# Patient Record
Sex: Female | Born: 1960 | Race: Black or African American | Hispanic: No | Marital: Single | Smoking: Never smoker
Health system: Southern US, Community
[De-identification: ages and names within clinical notes are randomized; demographics above are authoritative.]

## PROBLEM LIST (undated history)

## (undated) DIAGNOSIS — G4730 Sleep apnea, unspecified: Secondary | ICD-10-CM

## (undated) DIAGNOSIS — E890 Postprocedural hypothyroidism: Secondary | ICD-10-CM

## (undated) DIAGNOSIS — I10 Essential (primary) hypertension: Secondary | ICD-10-CM

## (undated) DIAGNOSIS — D649 Anemia, unspecified: Secondary | ICD-10-CM

## (undated) DIAGNOSIS — M722 Plantar fascial fibromatosis: Secondary | ICD-10-CM

## (undated) DIAGNOSIS — E039 Hypothyroidism, unspecified: Secondary | ICD-10-CM

## (undated) DIAGNOSIS — M545 Low back pain: Secondary | ICD-10-CM

## (undated) DIAGNOSIS — K649 Unspecified hemorrhoids: Secondary | ICD-10-CM

## (undated) DIAGNOSIS — D139 Benign neoplasm of ill-defined sites within the digestive system: Secondary | ICD-10-CM

## (undated) DIAGNOSIS — E042 Nontoxic multinodular goiter: Secondary | ICD-10-CM

## (undated) DIAGNOSIS — G4700 Insomnia, unspecified: Secondary | ICD-10-CM

## (undated) DIAGNOSIS — K449 Diaphragmatic hernia without obstruction or gangrene: Secondary | ICD-10-CM

## (undated) HISTORY — DX: Sleep apnea, unspecified: G47.30

## (undated) HISTORY — DX: Benign neoplasm of ill-defined sites within the digestive system: D13.9

## (undated) HISTORY — DX: Low back pain: M54.5

## (undated) HISTORY — PX: ANKLE SURGERY: SHX546

## (undated) HISTORY — DX: Insomnia, unspecified: G47.00

## (undated) HISTORY — DX: Unspecified hemorrhoids: K64.9

## (undated) HISTORY — DX: Essential (primary) hypertension: I10

## (undated) HISTORY — PX: PANENDOSCOPY: SHX2159

## (undated) HISTORY — DX: Nontoxic multinodular goiter: E04.2

## (undated) HISTORY — DX: Plantar fascial fibromatosis: M72.2

## (undated) HISTORY — DX: Anemia, unspecified: D64.9

## (undated) HISTORY — DX: Postprocedural hypothyroidism: E89.0

## (undated) HISTORY — DX: Hypothyroidism, unspecified: E03.9

## (undated) HISTORY — DX: Diaphragmatic hernia without obstruction or gangrene: K44.9

---

## 1999-10-23 ENCOUNTER — Other Ambulatory Visit: Admission: RE | Admit: 1999-10-23 | Discharge: 1999-10-23 | Payer: Self-pay | Admitting: Obstetrics and Gynecology

## 2002-06-24 ENCOUNTER — Other Ambulatory Visit: Admission: RE | Admit: 2002-06-24 | Discharge: 2002-06-24 | Payer: Self-pay | Admitting: *Deleted

## 2003-06-27 ENCOUNTER — Other Ambulatory Visit: Admission: RE | Admit: 2003-06-27 | Discharge: 2003-06-27 | Payer: Self-pay | Admitting: Obstetrics and Gynecology

## 2004-01-02 ENCOUNTER — Ambulatory Visit: Payer: Self-pay | Admitting: Pulmonary Disease

## 2004-01-02 ENCOUNTER — Ambulatory Visit (HOSPITAL_BASED_OUTPATIENT_CLINIC_OR_DEPARTMENT_OTHER): Admission: RE | Admit: 2004-01-02 | Discharge: 2004-01-02 | Payer: Self-pay | Admitting: Pulmonary Disease

## 2004-01-24 ENCOUNTER — Ambulatory Visit: Payer: Self-pay | Admitting: Pulmonary Disease

## 2004-02-24 ENCOUNTER — Ambulatory Visit: Payer: Self-pay | Admitting: Pulmonary Disease

## 2004-05-07 ENCOUNTER — Ambulatory Visit (HOSPITAL_BASED_OUTPATIENT_CLINIC_OR_DEPARTMENT_OTHER): Admission: RE | Admit: 2004-05-07 | Discharge: 2004-05-07 | Payer: Self-pay | Admitting: Pulmonary Disease

## 2004-05-11 ENCOUNTER — Ambulatory Visit: Payer: Self-pay | Admitting: Pulmonary Disease

## 2004-06-08 ENCOUNTER — Ambulatory Visit: Payer: Self-pay | Admitting: Pulmonary Disease

## 2004-11-20 ENCOUNTER — Ambulatory Visit: Payer: Self-pay | Admitting: Internal Medicine

## 2004-12-10 ENCOUNTER — Ambulatory Visit: Payer: Self-pay | Admitting: Internal Medicine

## 2005-01-08 ENCOUNTER — Ambulatory Visit: Payer: Self-pay | Admitting: Internal Medicine

## 2005-07-03 ENCOUNTER — Encounter: Payer: Self-pay | Admitting: Endocrinology

## 2005-11-05 ENCOUNTER — Encounter: Payer: Self-pay | Admitting: Endocrinology

## 2005-11-14 ENCOUNTER — Encounter: Payer: Self-pay | Admitting: Endocrinology

## 2005-11-27 ENCOUNTER — Other Ambulatory Visit: Admission: RE | Admit: 2005-11-27 | Discharge: 2005-11-27 | Payer: Self-pay | Admitting: Obstetrics and Gynecology

## 2006-02-13 ENCOUNTER — Emergency Department (HOSPITAL_COMMUNITY): Admission: EM | Admit: 2006-02-13 | Discharge: 2006-02-13 | Payer: Self-pay | Admitting: Emergency Medicine

## 2006-03-17 ENCOUNTER — Ambulatory Visit: Payer: Self-pay | Admitting: Gastroenterology

## 2006-03-19 ENCOUNTER — Ambulatory Visit: Payer: Self-pay | Admitting: Gastroenterology

## 2006-03-19 ENCOUNTER — Encounter (INDEPENDENT_AMBULATORY_CARE_PROVIDER_SITE_OTHER): Payer: Self-pay | Admitting: Specialist

## 2006-03-19 DIAGNOSIS — D139 Benign neoplasm of ill-defined sites within the digestive system: Secondary | ICD-10-CM

## 2006-03-19 DIAGNOSIS — K449 Diaphragmatic hernia without obstruction or gangrene: Secondary | ICD-10-CM

## 2006-03-19 DIAGNOSIS — D1399 Benign neoplasm of ill-defined sites within the digestive system: Secondary | ICD-10-CM

## 2006-03-19 HISTORY — DX: Benign neoplasm of ill-defined sites within the digestive system: D13.99

## 2006-03-19 HISTORY — DX: Benign neoplasm of ill-defined sites within the digestive system: D13.9

## 2006-03-19 HISTORY — DX: Diaphragmatic hernia without obstruction or gangrene: K44.9

## 2006-04-17 ENCOUNTER — Ambulatory Visit: Payer: Self-pay | Admitting: Gastroenterology

## 2006-04-17 LAB — CONVERTED CEMR LAB
ALT: 15 units/L (ref 0–40)
AST: 17 units/L (ref 0–37)
Bilirubin, Direct: 0.1 mg/dL (ref 0.0–0.3)
Eosinophils Relative: 1.3 % (ref 0.0–5.0)
Ferritin: 27.8 ng/mL (ref 10.0–291.0)
HCT: 34.5 % — ABNORMAL LOW (ref 36.0–46.0)
Neutrophils Relative %: 54.8 % (ref 43.0–77.0)
RBC: 3.86 M/uL — ABNORMAL LOW (ref 3.87–5.11)
RDW: 15.1 % — ABNORMAL HIGH (ref 11.5–14.6)
Total Protein: 8.5 g/dL — ABNORMAL HIGH (ref 6.0–8.3)
WBC: 8.6 10*3/uL (ref 4.5–10.5)

## 2006-08-08 ENCOUNTER — Ambulatory Visit: Payer: Self-pay | Admitting: Internal Medicine

## 2006-08-08 LAB — CONVERTED CEMR LAB
Iron: 41 ug/dL — ABNORMAL LOW (ref 42–145)
Saturation Ratios: 11.8 % — ABNORMAL LOW (ref 20.0–50.0)
Transferrin: 248.6 mg/dL (ref >=212.0)

## 2006-09-05 ENCOUNTER — Ambulatory Visit: Payer: Self-pay | Admitting: Internal Medicine

## 2006-09-05 LAB — CONVERTED CEMR LAB
AST: 18 units/L (ref 0–37)
Albumin: 3.4 g/dL — ABNORMAL LOW (ref 3.5–5.2)
Basophils Absolute: 0.1 10*3/uL (ref 0.0–0.1)
Bilirubin, Direct: 0.1 mg/dL (ref 0.0–0.3)
Chloride: 104 meq/L (ref 96–112)
Cholesterol: 93 mg/dL (ref 0–200)
Eosinophils Absolute: 0.1 10*3/uL (ref 0.0–0.6)
GFR calc Af Amer: 140 mL/min
GFR calc non Af Amer: 116 mL/min
Glucose, Bld: 94 mg/dL (ref 70–99)
HCT: 37.7 % (ref 36.0–46.0)
Lymphocytes Relative: 36.9 % (ref 12.0–46.0)
MCHC: 33.2 g/dL (ref 30.0–36.0)
MCV: 91.2 fL (ref 78.0–100.0)
Neutro Abs: 4.6 10*3/uL (ref 1.4–7.7)
Neutrophils Relative %: 54.5 % (ref 43.0–77.0)
Potassium: 3.7 meq/L (ref 3.5–5.1)
RBC: 4.13 M/uL (ref 3.87–5.11)
Sodium: 140 meq/L (ref 135–145)
TSH: 0.85 microintl units/mL (ref 0.35–5.50)
Total CHOL/HDL Ratio: 2.7
Triglycerides: 61 mg/dL (ref 0–149)

## 2006-10-28 ENCOUNTER — Ambulatory Visit: Payer: Self-pay | Admitting: Gastroenterology

## 2006-11-19 ENCOUNTER — Encounter: Payer: Self-pay | Admitting: Internal Medicine

## 2006-11-27 DIAGNOSIS — I10 Essential (primary) hypertension: Secondary | ICD-10-CM | POA: Insufficient documentation

## 2006-11-27 DIAGNOSIS — D649 Anemia, unspecified: Secondary | ICD-10-CM | POA: Insufficient documentation

## 2006-11-27 HISTORY — DX: Essential (primary) hypertension: I10

## 2006-11-27 HISTORY — DX: Anemia, unspecified: D64.9

## 2007-04-14 ENCOUNTER — Encounter: Payer: Self-pay | Admitting: Internal Medicine

## 2007-06-20 DIAGNOSIS — G4730 Sleep apnea, unspecified: Secondary | ICD-10-CM | POA: Insufficient documentation

## 2007-06-20 DIAGNOSIS — K649 Unspecified hemorrhoids: Secondary | ICD-10-CM | POA: Insufficient documentation

## 2007-06-20 HISTORY — DX: Sleep apnea, unspecified: G47.30

## 2007-06-20 HISTORY — DX: Unspecified hemorrhoids: K64.9

## 2007-06-23 ENCOUNTER — Telehealth: Payer: Self-pay | Admitting: *Deleted

## 2007-06-29 ENCOUNTER — Ambulatory Visit: Payer: Self-pay | Admitting: Internal Medicine

## 2007-06-29 LAB — CONVERTED CEMR LAB
Basophils Relative: 0.8 % (ref 0.0–1.0)
CO2: 28 meq/L (ref 19–32)
Creatinine, Ser: 0.7 mg/dL (ref 0.4–1.2)
Eosinophils Relative: 1.7 % (ref 0.0–5.0)
GFR calc non Af Amer: 97 mL/min
Glucose, Bld: 86 mg/dL (ref 70–99)
HCT: 38.3 % (ref 36.0–46.0)
Hemoglobin: 12.7 g/dL (ref 12.0–15.0)
Iron: 43 ug/dL (ref 42–145)
Lymphocytes Relative: 39.4 % (ref 12.0–46.0)
Monocytes Absolute: 0.5 10*3/uL (ref 0.1–1.0)
Monocytes Relative: 5.1 % (ref 3.0–12.0)
Neutro Abs: 5.4 10*3/uL (ref 1.4–7.7)
RBC: 4.16 M/uL (ref 3.87–5.11)
WBC: 10.3 10*3/uL (ref 4.5–10.5)

## 2007-11-24 ENCOUNTER — Encounter: Payer: Self-pay | Admitting: Internal Medicine

## 2008-02-19 ENCOUNTER — Ambulatory Visit: Payer: Self-pay | Admitting: Internal Medicine

## 2008-03-03 ENCOUNTER — Encounter: Payer: Self-pay | Admitting: Endocrinology

## 2008-06-13 ENCOUNTER — Ambulatory Visit: Payer: Self-pay | Admitting: Internal Medicine

## 2008-06-13 LAB — CONVERTED CEMR LAB
Eosinophils Absolute: 0.2 10*3/uL (ref 0.0–0.7)
Eosinophils Relative: 2.1 % (ref 0.0–5.0)
GFR calc non Af Amer: 99.43 mL/min (ref >60)
Glucose, Bld: 92 mg/dL (ref 70–99)
Iron: 33 ug/dL — ABNORMAL LOW (ref 42–145)
MCV: 91.8 fL (ref 78.0–100.0)
Monocytes Absolute: 0.7 10*3/uL (ref 0.1–1.0)
Neutrophils Relative %: 51.1 % (ref 43.0–77.0)
Platelets: 275 10*3/uL (ref 150.0–400.0)
Potassium: 3.2 meq/L — ABNORMAL LOW (ref 3.5–5.1)
Saturation Ratios: 11 % — ABNORMAL LOW (ref 20.0–50.0)
Sodium: 142 meq/L (ref 135–145)
Transferrin: 214.9 mg/dL (ref 212.0–360.0)
Vitamin B-12: 660 pg/mL (ref 211–911)
WBC: 9.6 10*3/uL (ref 4.5–10.5)

## 2008-11-28 ENCOUNTER — Telehealth (INDEPENDENT_AMBULATORY_CARE_PROVIDER_SITE_OTHER): Payer: Self-pay | Admitting: *Deleted

## 2008-11-29 ENCOUNTER — Telehealth: Payer: Self-pay | Admitting: Internal Medicine

## 2008-12-19 ENCOUNTER — Encounter: Payer: Self-pay | Admitting: Internal Medicine

## 2008-12-22 ENCOUNTER — Encounter: Payer: Self-pay | Admitting: Internal Medicine

## 2009-01-16 ENCOUNTER — Encounter: Payer: Self-pay | Admitting: Internal Medicine

## 2009-01-20 ENCOUNTER — Encounter: Payer: Self-pay | Admitting: Endocrinology

## 2009-04-15 LAB — CONVERTED CEMR LAB: Pap Smear: NORMAL

## 2009-07-13 ENCOUNTER — Ambulatory Visit: Payer: Self-pay | Admitting: Internal Medicine

## 2009-07-13 DIAGNOSIS — M722 Plantar fascial fibromatosis: Secondary | ICD-10-CM

## 2009-07-13 HISTORY — DX: Plantar fascial fibromatosis: M72.2

## 2009-07-18 LAB — CONVERTED CEMR LAB
ALT: 15 units/L (ref 0–35)
AST: 18 units/L (ref 0–37)
BUN: 11 mg/dL (ref 6–23)
Basophils Absolute: 0 10*3/uL (ref 0.0–0.1)
Bilirubin, Direct: 0 mg/dL (ref 0.0–0.3)
Calcium: 8.6 mg/dL (ref 8.4–10.5)
Creatinine, Ser: 0.6 mg/dL (ref 0.4–1.2)
Eosinophils Relative: 1.8 % (ref 0.0–5.0)
GFR calc non Af Amer: 137.93 mL/min (ref >60)
Monocytes Relative: 4.7 % (ref 3.0–12.0)
Neutrophils Relative %: 52.7 % (ref 43.0–77.0)
Platelets: 314 10*3/uL (ref 150.0–400.0)
Total Bilirubin: 0.4 mg/dL (ref 0.3–1.2)
WBC: 9.3 10*3/uL (ref 4.5–10.5)

## 2009-08-03 ENCOUNTER — Ambulatory Visit: Payer: Self-pay | Admitting: Endocrinology

## 2009-08-03 DIAGNOSIS — E042 Nontoxic multinodular goiter: Secondary | ICD-10-CM

## 2009-08-03 HISTORY — DX: Nontoxic multinodular goiter: E04.2

## 2009-08-11 ENCOUNTER — Telehealth (INDEPENDENT_AMBULATORY_CARE_PROVIDER_SITE_OTHER): Payer: Self-pay | Admitting: *Deleted

## 2009-08-17 ENCOUNTER — Encounter (HOSPITAL_COMMUNITY): Admission: RE | Admit: 2009-08-17 | Discharge: 2009-11-15 | Payer: Self-pay | Admitting: Endocrinology

## 2009-09-07 ENCOUNTER — Telehealth (INDEPENDENT_AMBULATORY_CARE_PROVIDER_SITE_OTHER): Payer: Self-pay | Admitting: *Deleted

## 2009-09-22 ENCOUNTER — Ambulatory Visit: Payer: Self-pay | Admitting: Family Medicine

## 2009-09-22 DIAGNOSIS — M545 Low back pain, unspecified: Secondary | ICD-10-CM

## 2009-09-22 HISTORY — DX: Low back pain, unspecified: M54.50

## 2009-11-24 ENCOUNTER — Ambulatory Visit (HOSPITAL_COMMUNITY): Admission: RE | Admit: 2009-11-24 | Discharge: 2009-11-24 | Payer: Self-pay | Admitting: Endocrinology

## 2009-12-01 ENCOUNTER — Telehealth: Payer: Self-pay | Admitting: Endocrinology

## 2009-12-01 ENCOUNTER — Ambulatory Visit: Payer: Self-pay | Admitting: Endocrinology

## 2009-12-01 DIAGNOSIS — M542 Cervicalgia: Secondary | ICD-10-CM | POA: Insufficient documentation

## 2009-12-01 LAB — CONVERTED CEMR LAB
Eosinophils Relative: 1.4 % (ref 0.0–5.0)
HCT: 34.9 % — ABNORMAL LOW (ref 36.0–46.0)
Hemoglobin: 11.9 g/dL — ABNORMAL LOW (ref 12.0–15.0)
Lymphs Abs: 1.9 10*3/uL (ref 0.7–4.0)
MCV: 90.9 fL (ref 78.0–100.0)
Monocytes Absolute: 0.3 10*3/uL (ref 0.1–1.0)
Neutro Abs: 4.8 10*3/uL (ref 1.4–7.7)
Platelets: 240 10*3/uL (ref 150.0–400.0)
Sed Rate: 69 mm/hr — ABNORMAL HIGH (ref 0–22)
WBC: 9 10*3/uL (ref 4.5–10.5)

## 2009-12-06 ENCOUNTER — Telehealth: Payer: Self-pay | Admitting: Internal Medicine

## 2009-12-06 ENCOUNTER — Ambulatory Visit: Payer: Self-pay | Admitting: Internal Medicine

## 2009-12-06 LAB — CONVERTED CEMR LAB
ALT: 16 units/L (ref 0–35)
Alkaline Phosphatase: 88 units/L (ref 39–117)
BUN: 12 mg/dL (ref 6–23)
Basophils Relative: 0.4 % (ref 0.0–3.0)
Bilirubin Urine: NEGATIVE
Bilirubin, Direct: 0 mg/dL (ref 0.0–0.3)
Blood in Urine, dipstick: NEGATIVE
Calcium: 8.6 mg/dL (ref 8.4–10.5)
Cholesterol: 97 mg/dL (ref 0–200)
Creatinine, Ser: 0.6 mg/dL (ref 0.4–1.2)
Eosinophils Relative: 1.6 % (ref 0.0–5.0)
GFR calc non Af Amer: 149.1 mL/min (ref >60)
HDL: 35.8 mg/dL — ABNORMAL LOW (ref >39.00)
Ketones, urine, test strip: NEGATIVE
LDL Cholesterol: 53 mg/dL (ref 0–99)
Lymphocytes Relative: 31.9 % (ref 12.0–46.0)
MCV: 92.2 fL (ref 78.0–100.0)
Monocytes Absolute: 0.5 10*3/uL (ref 0.1–1.0)
Neutrophils Relative %: 59 % (ref 43.0–77.0)
Nitrite: NEGATIVE
Platelets: 277 10*3/uL (ref 150.0–400.0)
Protein, U semiquant: NEGATIVE
RBC: 3.91 M/uL (ref 3.87–5.11)
Total Bilirubin: 0.4 mg/dL (ref 0.3–1.2)
Total CHOL/HDL Ratio: 3
Total Protein: 7.4 g/dL (ref 6.0–8.3)
Triglycerides: 40 mg/dL (ref 0.0–149.0)
Urobilinogen, UA: 0.2
VLDL: 8 mg/dL (ref 0.0–40.0)
WBC: 7.1 10*3/uL (ref 4.5–10.5)

## 2009-12-20 ENCOUNTER — Ambulatory Visit: Payer: Self-pay | Admitting: Internal Medicine

## 2009-12-20 ENCOUNTER — Encounter: Payer: Self-pay | Admitting: Internal Medicine

## 2010-03-14 ENCOUNTER — Encounter
Admission: RE | Admit: 2010-03-14 | Discharge: 2010-03-14 | Payer: Self-pay | Source: Home / Self Care | Attending: Internal Medicine | Admitting: Internal Medicine

## 2010-03-14 LAB — HM MAMMOGRAPHY: HM Mammogram: NEGATIVE

## 2010-03-22 ENCOUNTER — Ambulatory Visit: Admit: 2010-03-22 | Payer: Self-pay | Admitting: Internal Medicine

## 2010-03-28 ENCOUNTER — Ambulatory Visit: Admit: 2010-03-28 | Payer: Self-pay | Admitting: Internal Medicine

## 2010-04-07 ENCOUNTER — Encounter: Payer: Self-pay | Admitting: Internal Medicine

## 2010-04-08 ENCOUNTER — Encounter: Payer: Self-pay | Admitting: Internal Medicine

## 2010-04-08 ENCOUNTER — Encounter: Payer: Self-pay | Admitting: Endocrinology

## 2010-04-17 NOTE — Assessment & Plan Note (Signed)
Summary: PER ROBIN--OV TODAY--STC   Vital Signs:  Patient profile:   50 year old female Height:      67 inches (170.18 cm) Weight:      229.50 pounds (104.32 kg) BMI:     36.07 O2 Sat:      98 % on Room air Temp:     98.7 degrees F (37.06 degrees C) oral Pulse rate:   94 / minute BP sitting:   152 / 90  (left arm) Cuff size:   large  Vitals Entered By: Brenton Grills MA (December 01, 2009 4:16 PM)  O2 Flow:  Room air CC: Soreness in throat/recently took radioactive iodine pill/pt no longer taking Diclofenac/aj  7  Primary Provider:  Dr Lovell Sheehan  CC:  Soreness in throat/recently took radioactive iodine pill/pt no longer taking Diclofenac/aj.  History of Present Illness: pt had i-131 rx for hyperthyroidism due to multinodular goiter, 1 week ago today.  she now has 3 days of pain at the neck, worst at the right lateral neck.  no injury.  she says these sxs do not resemble upper respiratory infections she has had in the past.    Current Medications (verified): 1)  Amlodipine Besylate 10 Mg Tabs (Amlodipine Besylate) .... One By Mouth Daily 2)  Diclofenac Sodium 75 Mg Tbec (Diclofenac Sodium) .... One By Mouth Two Times A Day As Needed Pain  Allergies (verified): No Known Drug Allergies  Past History:  Past Medical History: Last updated: 11/27/2006 Insomnia Anemia-NOS Hypertension  Review of Systems  The patient denies fever.    Physical Exam  General:  obese.  no distress  Head:  the right parotid is nontender Neck:  i do not appreciate the goiter.  there is slight tenderness (but no swelling or erythema), at the right lateral neck (not near the thyroid).   Cervical Nodes:  No significant adenopathy.  Additional Exam:  Hemoglobin           [L]  11.9 g/dL                   52.8-41.3 Hematocrit           [L]  34.9 %      Sed Rate             [H]  69 mm/hr                    0-22 Amylase                   89 U/L         Impression & Recommendations:  Problem #  1:  NECK PAIN, RIGHT (ICD-723.1) Assessment New uncertain etiology  Problem # 2:  ANEMIA-NOS (ICD-285.9) uncertain etiology  Medications Added to Medication List This Visit: 1)  Azithromycin 500 Mg Tabs (Azithromycin) .Marland Kitchen.. 1 tab once daily  Other Orders: TLB-CBC Platelet - w/Differential (85025-CBCD) TLB-Sedimentation Rate (ESR) (85652-ESR) TLB-Amylase (82150-AMYL) Est. Patient Level III (24401) Est. Patient Level IV (02725)  Patient Instructions: 1)  blood tests are being ordered for you today.  please call [phone] to hear your test results. 2)  return approx 6 weeks as scheduled. 3)  here is a prescription for azithromycin 500 mg once daily. 4)  (update: i left message on phone-tree:  esr is elevated, possibly due to i-131 rx.  call next week if sxs persist). Prescriptions: AZITHROMYCIN 500 MG TABS (AZITHROMYCIN) 1 tab once daily  #6 x 0   Entered and  Authorized by:   Minus Breeding MD   Signed by:   Minus Breeding MD on 12/01/2009   Method used:   Print then Give to Patient   RxID:   [id number]

## 2010-04-17 NOTE — Assessment & Plan Note (Signed)
Summary: cpx/no pap/njr ok per bonnie/njr   Vital Signs:  Patient profile:   50 year old female Height:      67 inches Weight:      225 pounds BMI:     35.37 Temp:     98.2 degrees F oral Pulse rate:   94 / minute Pulse rhythm:   regular Resp:     14 per minute BP sitting:   152 / 80  (left arm)  Vitals Entered By: Willy Eddy, LPN (December 20, 2009 4:02 PM) CC: cpx Is Patient Diabetic? No   Primary Care Provider:  Dr Lovell Sheehan  CC:  cpx.  History of Present Illness: The pt was asked about all immunizations, health maint. services that are appropriate to their age and was given guidance on diet exercize  and weight management  Hypertension Follow-Up      This is a 50 year old woman who presents for Hypertension follow-up.  The patient denies lightheadedness, urinary frequency, headaches, edema, impotence, rash, and fatigue.  The patient denies the following associated symptoms: chest pain, chest pressure, exercise intolerance, dyspnea, palpitations, syncope, leg edema, and pedal edema.  Compliance with medications (by patient report) has been near 100%.  The patient reports that dietary compliance has been good.  The patient reports exercising 3-4X per week.    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) 10)  Sleep Apnea  (ICD-780.57) 11)  Family History of Asthma  (ICD-V17.5) 12)  Hypertension  (ICD-401.9) 13)  Anemia-nos  (ICD-285.9)  Current Problems  (verified): 1)  Neck Pain, Right  (ICD-723.1) 2)  Back Pain, Lumbar  (ICD-724.2) 3)  Goiter, Multinodular  (ICD-241.1) 4)  Hyperthyroidism  (ICD-242.90) 5)  Plantar Fasciitis, Right  (ICD-728.71) 6)  Hemorrhoids  (ICD-455.6) 7)  Hiatal Hernia  (ICD-553.3) 8)  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 One By Mouth Daily 2)  Azithromycin 500 Mg Tabs (Azithromycin) .Marland Kitchen.. 1 Tab Once Daily  Current Medications (verified): 1)  Azor 10-20 Mg Tabs (Amlodipine-Olmesartan) .... One By Mouth Daily  Allergies (verified): No Known Drug Allergies  Contraindications/Deferment of Procedures/Staging:    Test/Procedure: FLU VAX    Reason for deferment: patient declined   Past History:  Family History: Last updated: 08/03/2009 Family History of Arthritis Family History of Asthma Family History Other cancer mother had resection of a benign goiter.  Social History: Last updated: 08/03/2009 Occupation: works Environmental manager mfg. single Never Smoked  Risk Factors: Smoking Status: never (12/20/2009) Passive Smoke Exposure: no (12/20/2009)  Past medical, surgical, family and social histories (including risk factors) reviewed, and no changes noted (except as noted below).  Past Medical History: Reviewed history from 11/27/2006 and no changes required. Insomnia Anemia-NOS Hypertension  Past Surgical History: Reviewed  history from 06/20/2007 and no changes required. Panendoscopy R ankle surgery (1995)  Family History: Reviewed history from 08/03/2009 and no changes required. Family History of Arthritis Family History of Asthma Family History Other cancer mother had resection of a benign goiter.  Social History: Reviewed history from  08/03/2009 and no changes required. 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    Physical Exam  General:  Well-developed,well-nourished,in no acute distress; alert,appropriate and cooperative throughout examination Eyes:  pupils equal and pupils round.   Ears:  R ear normal and L ear normal.   Nose:  no external deformity and no nasal discharge.   Neck:  No deformities, masses, or tenderness noted. Lungs:  normal respiratory effort and no crackles.   Heart:  normal rate and regular rhythm.   Abdomen:  soft and non-tender.   Extremities:  No clubbing, cyanosis, edema, or deformity noted with normal full range of motion of all joints.   Neurologic:  gait normal and DTRs symmetrical and normal.     Impression & Recommendations:  Problem # 1:  HYPERTENSION (ICD-401.9)  Her updated medication list for this problem includes:    Azor 10-20 Mg Tabs (Amlodipine-olmesartan) ..... One by mouth daily  BP today: 152/80 Prior BP: 152/90 (12/01/2009)  Prior 10 Yr Risk Heart Disease: 7 % (06/13/2008)  Labs Reviewed: K+: 3.9 (12/06/2009) Creat: : 0.6 (12/06/2009)   Chol: 97 (12/06/2009)   HDL: 35.80 (12/06/2009)   LDL: 53 (12/06/2009)   TG: 40.0 (12/06/2009)  Problem # 2:  GOITER, MULTINODULAR (ICD-241.1) had radioactive treatment of goiter monitering of tsh and t3 free and t4 free  Problem # 3:  BACK PAIN, LUMBAR (ICD-724.2)  Discussed use of moist heat or  ice, modified activities, medications, and stretching/strengthening exercises. Back care instructions given. To be seen in 2 weeks if no improvement; sooner if worsening of symptoms.   Complete Medication List: 1)  Azor 10-20 Mg Tabs (Amlodipine-olmesartan) .... One by mouth daily  Other Orders: EKG w/ Interpretation (93000)  Patient Instructions: 1)  Please schedule a follow-up appointment in 3 months. 2)  TSH prior to visit, ICD-9:244.8 3)  T4 free and t3 free  244.8 Prescriptions: AZOR 10-20 MG TABS (AMLODIPINE-OLMESARTAN) one by mouth daily  #30 x 3   Entered and Authorized by:   Stacie Glaze MD   Signed by:   Stacie Glaze MD on 12/20/2009   Method used:   Print then Give to Patient   RxID:   [id number]    Preventive Care Screening  Mammogram:    Date:  02/14/2009    Next Due:  02/2010    Results:  normal   Pap Smear:    Date:  04/15/2009    Next Due:  04/2010    Results:  normal

## 2010-04-17 NOTE — Assessment & Plan Note (Signed)
Summary: pain in right side towards back/cjr   Vital Signs:  Patient profile:   50 year old female Weight:      225 pounds Temp:     97.9 degrees F oral BP sitting:   130 / 90  (left arm) Cuff size:   large  Vitals Entered By: Kathrynn Speed CMA (September 22, 2009 4:28 PM) CC: Pain Right side of hip , can't bend over, happen this morning was changing shoes, src   History of Present Illness: Patient seen with onset earlier today pain mostly right gluteal region.  she describes sharp pain of moderate intensity worse with bending. Similar episode about 6 months ago that eventually resolved. Denies any numbness or weakness. Took some Aleve with mild relief. No radiculopathy symptoms otherwise. No specific injury. No urinary symptoms. No pain with ambulation.  Current Medications (verified): 1)  Amlodipine Besylate 10 Mg Tabs (Amlodipine Besylate) .... One By Mouth Daily  Allergies (verified): No Known Drug Allergies  Past History:  Past Medical History: Last updated: 11/27/2006 Insomnia Anemia-NOS Hypertension PMH reviewed for relevance  Review of Systems      See HPI  Physical Exam  General:  Well-developed,well-nourished,in no acute distress; alert,appropriate and cooperative throughout examination Lungs:  Normal respiratory effort, chest expands symmetrically. Lungs are clear to auscultation, no crackles or wheezes. Heart:  normal rate and regular rhythm.   Msk:  straight leg raise is negative. Minimal tenderness right lower lumbar region Extremities:  No clubbing, cyanosis, edema, or deformity noted with normal full range of motion of all joints.   Neurologic:  strength normal in all extremities, gait normal, and DTRs symmetrical and normal.     Impression & Recommendations:  Problem # 1:  BACK PAIN, LUMBAR (ICD-724.2) Try heat or ice for symptom relief.  diclofenac and follow up with primary 2 weeks if no better. Her updated medication list for this problem includes:   Diclofenac Sodium 75 Mg Tbec (Diclofenac sodium) ..... One by mouth two times a day as needed pain  Complete Medication List: 1)  Amlodipine Besylate 10 Mg Tabs (Amlodipine besylate) .... One by mouth daily 2)  Diclofenac Sodium 75 Mg Tbec (Diclofenac sodium) .... One by mouth two times a day as needed pain  Patient Instructions: 1)  Most patients (90%) with low back pain will improve with time ( 2-6 weeks). Keep active but avoid activities that are painful. Apply moist heat and/or ice to lower back several times a day.  Prescriptions: DICLOFENAC SODIUM 75 MG TBEC (DICLOFENAC SODIUM) one by mouth two times a day as needed pain  #40 x 0   Entered and Authorized by:   Evelena Peat MD   Signed by:   Evelena Peat MD on 09/22/2009   Method used:   Electronically to        [id number] (retail)       [address]       [place], [place]  [address]       Ph: [phone] or [phone]       Fax: [phone]   RxID:   [id number]

## 2010-04-17 NOTE — Assessment & Plan Note (Signed)
Summary: new endo/goiter/bcbs/jenkins/#/cd   Vital Signs:  Patient profile:   50 year old female Height:      67 inches (170.18 cm) Weight:      227.50 pounds (103.41 kg) BMI:     35.76 O2 Sat:      95 % on Room air Temp:     97.5 degrees F (36.39 degrees C) oral Pulse rate:   94 / minute BP sitting:   138 / 90  (left arm) Cuff size:   large  Vitals Entered By: Josph Macho RMA (Aug 03, 2009 4:03 PM)  O2 Flow:  Room air CC: New Endo: Goiter / pt states she referred herself/ [facility]   Primary Provider:  Dr Lovell Sheehan  CC:  New Endo: Goiter / pt states she referred herself/ [facility].  History of Present Illness: pt states few years of "goiter with nodules" followed at [facility] dr.  it has been followed with serial ultrasounds.  symptomatically, she has few years of intermittent dry-quality cough, and associated hoarseness.  she says she had laryngoscopy--"1 weak vocal cord." she has never been on thyroid medication.    Current Medications (verified): 1)  Amlodipine Besylate 10 Mg Tabs (Amlodipine Besylate) .... One By Mouth Daily  Allergies (verified): No Known Drug Allergies  Past History:  Past Medical History: Last updated: 11/27/2006 Insomnia Anemia-NOS Hypertension  Family History: Reviewed history from 11/27/2006 and no changes required. Family History of Arthritis Family History of Asthma Family History Other cancer mother had resection of a benign goiter.  Social History: Reviewed history from 11/27/2006 and no changes required. 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         denies headache, double vision, palpitations, diarrhea, myalgias, excessive diaphoresis, numbness, tremor, anxiety, easy bruising, and rhinorrhea. she states fatigue, polyuria,  and doe.  menses are irregular.  Physical Exam  General:  normal appearance.   Head:  head: no deformity eyes: no  periorbital swelling, no proptosis external nose and ears are normal mouth: no lesion seen Neck:  i do not appreciate the goiter.   Lungs:  Clear to auscultation bilaterally. Normal respiratory effort.  Heart:  Regular rate and rhythm without murmurs or gallops noted. Normal S1,S2.   Msk:  muscle bulk and strength are grossly normal.  no obvious joint swelling.  gait is normal and steady  Extremities:  no deformity no edema Neurologic:  cn 2-12 grossly intact.   readily moves all 4's.    Skin:  normal texture and temp.  no rash.  not diaphoretic  Cervical Nodes:  No significant adenopathy.  Psych:  Alert and cooperative; normal mood and affect; normal attention span and concentration.   Additional Exam:   FastTSH              [L]  0.13 uIU/mL     Impression & Recommendations:  Problem # 1:  HYPERTHYROIDISM (ICD-242.90) due to #2  Problem # 2:  GOITER, MULTINODULAR (ICD-241.1) usually hereditary  Problem # 3:  hoarseness very unlikely related to #2  Other Orders: Radiology Referral (Radiology) Consultation Level IV (04540)  Patient Instructions: 1)  check "scan" a special but easy type of thyroid x ray. it will see if the radioactive iodine treatment is feasable. 2)  please call [phone] to hear your test results.

## 2010-04-17 NOTE — Progress Notes (Signed)
  Phone Note Call from Patient   Caller: Patient Summary of Call: Patient called and left msg. on Triage at 10:39am. Patient stated she had a radioactive iodine pill on friday. Her throat has been bothering her and she did not know if this was a side effect or if she should schedule appointment. She can be reached before 3:30 at [phone] and after 3:30 at [phone]. Initial call taken by: Robin Ewing CMA Duncan Dull),  December 01, 2009 11:12 AM  Follow-up for Phone Call        it is unlikely to be a side-effect from the size pill you took, but i would be happy to see you today. Follow-up by: Minus Breeding MD,  December 01, 2009 11:16 AM  Additional Follow-up for Phone Call Additional follow up Details #1::        called pt. and informed of above information. Patient agreed to schedule OV with Dr. Everardo All. Additional Follow-up by: Robin Ewing CMA (AAMA),  December 01, 2009 11:22 AM

## 2010-04-17 NOTE — Letter (Signed)
Summary: [facility]  [facility]   Imported By: Lester Alba 08/17/2009 11:37:41  _____________________________________________________________________  External Attachment:    Type:   Image     Comment:   External Document

## 2010-04-17 NOTE — Assessment & Plan Note (Signed)
Summary: throat pain/njr/pt rescd//ccm   Vital Signs:  Patient profile:   50 year old female Weight:      228 pounds Temp:     97.9 degrees F oral BP sitting:   138 / 72  (right arm) Cuff size:   large  Vitals Entered By: Duard Brady LPN (July 13, 2009 3:49 PM) CC: pt c/o of sob with exercise, c/o of rt foot pain Is Patient Diabetic? No   CC:  pt c/o of sob with exercise and c/o of rt foot pain.  History of Present Illness: 50 year old patient who is seen today with two complaints.  She complains of a one-month history of intermittent right medial heel pain. She is treated hypertension.  Her other complaint is exercise intolerance.  She feels that she is not able to exercise to her usual capacity without undue fatigue.  He denies really any exertional chest pain or shortness of breath.  Preventive Screening-Counseling & Management  Alcohol-Tobacco     Smoking Status: never  Allergies (verified): No Known Drug Allergies  Past History:  Past Medical History: Reviewed history from 11/27/2006 and no changes required. Insomnia Anemia-NOS Hypertension  Review of Systems  The patient denies anorexia, fever, weight loss, weight gain, vision loss, decreased hearing, hoarseness, chest pain, syncope, dyspnea on exertion, peripheral edema, prolonged cough, headaches, hemoptysis, abdominal pain, melena, hematochezia, severe indigestion/heartburn, hematuria, incontinence, genital sores, muscle weakness, suspicious skin lesions, transient blindness, difficulty walking, depression, unusual weight change, abnormal bleeding, enlarged lymph nodes, angioedema, and breast masses.    Physical Exam  General:  overweight-appearing.  130/72 Head:  Normocephalic and atraumatic without obvious abnormalities. No apparent alopecia or balding. Mouth:  Oral mucosa and oropharynx without lesions or exudates.  Teeth in good repair. Neck:  No deformities, masses, or tenderness noted. Lungs:   Normal respiratory effort, chest expands symmetrically. Lungs are clear to auscultation, no crackles or wheezes. O2 saturation 98% Heart:  Normal rate and regular rhythm. S1 and S2 normal without gallop, murmur, click, rub or other extra sounds. Msk:  examination of  the right heel revealed mild tenderness medially   Impression & Recommendations:  Problem # 1:  HYPERTENSION (ICD-401.9)  The following medications were removed from the medication list:    Lisinopril 10 Mg Tabs (Lisinopril) ..... One by mouth daily Her updated medication list for this problem includes:    Amlodipine Besylate 10 Mg Tabs (Amlodipine besylate) ..... One by mouth daily  Orders: Venipuncture (52841) TLB-BMP (Basic Metabolic Panel-BMET) (80048-METABOL) TLB-CBC Platelet - w/Differential (85025-CBCD) TLB-Hepatic/Liver Function Pnl (80076-HEPATIC) TLB-TSH (Thyroid Stimulating Hormone) (84443-TSH)  Problem # 2:  ANEMIA-NOS (ICD-285.9)  Orders: Venipuncture (32440) TLB-BMP (Basic Metabolic Panel-BMET) (80048-METABOL) TLB-CBC Platelet - w/Differential (85025-CBCD) TLB-Hepatic/Liver Function Pnl (80076-HEPATIC) TLB-TSH (Thyroid Stimulating Hormone) (84443-TSH)  Problem # 3:  PLANTAR FASCIITIS, RIGHT (ICD-728.71) Aleve two twice daily  Complete Medication List: 1)  Amlodipine Besylate 10 Mg Tabs (Amlodipine besylate) .... One by mouth daily  Patient Instructions: 1)  Please schedule a follow-up appointment in 4 months. 2)  Limit your Sodium (Salt). 3)  It is important that you exercise regularly at least 20 minutes 5 times a week. If you develop chest pain, have severe difficulty breathing, or feel very tired , stop exercising immediately and seek medical attention. 4)  You need to lose weight. Consider a lower calorie diet and regular exercise.  Prescriptions: AMLODIPINE BESYLATE 10 MG TABS (AMLODIPINE BESYLATE) one by mouth daily  #90 x 2   Entered and Authorized by:  Gordy Savers  MD   Signed  by:   Gordy Savers  MD on 07/13/2009   Method used:   Electronically to        [facility] (retail)       [address]       [place], [place]  [address]       Ph: [phone] or [phone]       Fax: [phone]   RxID:   [id number]

## 2010-04-17 NOTE — Progress Notes (Signed)
Summary: Pt request call  Phone Note Call from Patient Call back at Work Phone [phone]   Caller: Patient [phone] after 3p Summary of Call: Pt called stating that she has many questions and concerns about having radioactive procedure done and the possible side effects. Pt is requesting SAE call her to discuss. Initial call taken by: Margaret Pyle, CMA,  September 07, 2009 10:46 AM  Follow-up for Phone Call        i called pt 09/07/09, and answered questions.  pt would like to reschedule i-131 rx Follow-up by: Minus Breeding MD,  September 07, 2009 5:00 PM  Additional Follow-up for Phone Call Additional follow up Details #1::        Called patient and left message on machine to call reguarding rescheduling her appt Shelbie Proctor  September 20, 2009 8:06 AM

## 2010-04-17 NOTE — Progress Notes (Signed)
  Phone Note Other Incoming   Request: Send information Summary of Call: Records received from [facility]. 18 pages forwarded to Dr. Everardo All for review.

## 2010-04-17 NOTE — Progress Notes (Signed)
Summary: wants earlier cpx  Phone Note Call from Patient Call back at [phone]   Caller: Patient---live call Reason for Call: Insurance Question Complaint: Urinary/GYN Problems Summary of Call: pt has a cpx on 12-13-2009 at 2pm. Unable to come that day. Wants earlier cpx than 02-19-2010. Would like to be worked in asap. Please advise. Initial call taken by: Warnell Forester,  December 06, 2009 2:23 PM  Follow-up for Phone Call        norma- please fix/thanks Follow-up by: Willy Eddy, LPN,  December 06, 2009 2:25 PM  Additional Follow-up for Phone Call Additional follow up Details #1::        12-20-2009 330pm Additional Follow-up by: Heron Sabins,  December 06, 2009 3:04 PM

## 2010-04-23 ENCOUNTER — Ambulatory Visit (INDEPENDENT_AMBULATORY_CARE_PROVIDER_SITE_OTHER): Payer: BC Managed Care – PPO | Admitting: Endocrinology

## 2010-04-23 ENCOUNTER — Encounter (INDEPENDENT_AMBULATORY_CARE_PROVIDER_SITE_OTHER): Payer: Self-pay | Admitting: *Deleted

## 2010-04-23 ENCOUNTER — Encounter: Payer: Self-pay | Admitting: Endocrinology

## 2010-04-23 ENCOUNTER — Other Ambulatory Visit: Payer: Self-pay | Admitting: Endocrinology

## 2010-04-23 ENCOUNTER — Other Ambulatory Visit: Payer: BC Managed Care – PPO

## 2010-04-23 DIAGNOSIS — E890 Postprocedural hypothyroidism: Secondary | ICD-10-CM

## 2010-04-23 DIAGNOSIS — E0590 Thyrotoxicosis, unspecified without thyrotoxic crisis or storm: Secondary | ICD-10-CM

## 2010-04-23 HISTORY — DX: Postprocedural hypothyroidism: E89.0

## 2010-04-24 LAB — TSH: TSH: 25.69 u[IU]/mL — ABNORMAL HIGH (ref 0.35–5.50)

## 2010-05-03 NOTE — Assessment & Plan Note (Signed)
Summary: FU AND HOARSENESS  D/T   STC   Vital Signs:  Patient profile:   50 year old female Height:      67 inches (170.18 cm) Weight:      236.13 pounds (107.33 kg) BMI:     37.12 O2 Sat:      96 % on Room air Temp:     98.0 degrees F (36.67 degrees C) oral Pulse rate:   86 / minute Pulse rhythm:   regular BP sitting:   148 / 92  (left arm) Cuff size:   large  Vitals Entered By: Brenton Grills CMA Duncan Dull) (April 23, 2010 4:26 PM)  O2 Flow:  Room air CC: Follow on thyroid/aj Is Patient Diabetic? No   Primary Provider:  Dr Lovell Sheehan  CC:  Follow on thyroid/aj.  History of Present Illness: pt is 5 mos s/p i-131 rx for hyperthyroidism, due to a multinodular goiter.  she reports 1 month of moderate haorseness sensation in the throat, but no assoc pain.  she says she takes azor as rx'ed.    Current Medications (verified): 1)  Azor 10-20 Mg Tabs (Amlodipine-Olmesartan) .... One By Mouth Daily  Allergies (verified): No Known Drug Allergies  Past History:  Past Medical History: Last updated: 11/27/2006 Insomnia Anemia-NOS Hypertension  Review of Systems       The patient complains of weight gain.  The patient denies peripheral edema.    Physical Exam  General:  obese.  no distress  Neck:  i do not appreciate the goiter.  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 1 tab once daily  Other Orders: TLB-TSH (Thyroid Stimulating Hormone) (84443-TSH) TLB-T4  (Thyrox), Free [phone]) Est. Patient Level IV (40981)  Patient Instructions: 1)  blood tests are being ordered for you today.  please call [phone] to hear your test results. 2)  return approx 6 weeks 3)  please also schedule an appointment with dr Lovell Sheehan.   4)  (update: i left message on phone-tree:  start synthroid 100 micrograms/d) Prescriptions: LEVOTHYROXINE SODIUM 100 MCG TABS (LEVOTHYROXINE SODIUM) 1 tab once daily  #30 x 1   Entered and Authorized by:   Minus Breeding MD   Signed by:   Minus Breeding MD on 04/24/2010   Method used:   Electronically to        [facility] (retail)       [address]       [place], [place]  [address]       Ph: [phone] or [phone]       Fax: [phone]   RxID:   [id number]    Orders Added: 1)  TLB-TSH (Thyroid Stimulating Hormone) [84443-TSH] 2)  TLB-T4 (Thyrox), Free [16109-UE4V] 3)  Est. Patient Level IV [40981]

## 2010-05-07 ENCOUNTER — Encounter: Payer: Self-pay | Admitting: Internal Medicine

## 2010-05-07 ENCOUNTER — Ambulatory Visit (INDEPENDENT_AMBULATORY_CARE_PROVIDER_SITE_OTHER): Payer: BC Managed Care – PPO | Admitting: Internal Medicine

## 2010-05-07 VITALS — BP 140/74 | HR 84 | Temp 98.5°F | Resp 14 | Ht 65.0 in

## 2010-05-07 DIAGNOSIS — E042 Nontoxic multinodular goiter: Secondary | ICD-10-CM

## 2010-05-07 DIAGNOSIS — D649 Anemia, unspecified: Secondary | ICD-10-CM

## 2010-05-07 DIAGNOSIS — E039 Hypothyroidism, unspecified: Secondary | ICD-10-CM | POA: Insufficient documentation

## 2010-05-07 DIAGNOSIS — I10 Essential (primary) hypertension: Secondary | ICD-10-CM

## 2010-05-07 DIAGNOSIS — G4730 Sleep apnea, unspecified: Secondary | ICD-10-CM

## 2010-05-07 HISTORY — DX: Hypothyroidism, unspecified: E03.9

## 2010-05-07 LAB — BASIC METABOLIC PANEL
BUN: 10 mg/dL (ref 6–23)
CO2: 29 mEq/L (ref 19–32)
Chloride: 103 mEq/L (ref 96–112)
Glucose, Bld: 118 mg/dL — ABNORMAL HIGH (ref 70–99)
Potassium: 3 mEq/L — ABNORMAL LOW (ref 3.5–5.1)
Sodium: 142 mEq/L (ref 135–145)

## 2010-05-07 LAB — CBC WITH DIFFERENTIAL/PLATELET
Basophils Relative: 0.4 % (ref 0.0–3.0)
Eosinophils Relative: 1.3 % (ref 0.0–5.0)
HCT: 35.8 % — ABNORMAL LOW (ref 36.0–46.0)
Monocytes Relative: 5.5 % (ref 3.0–12.0)
Neutrophils Relative %: 57 % (ref 43.0–77.0)
Platelets: 275 10*3/uL (ref 150.0–400.0)
RBC: 3.91 Mil/uL (ref 3.87–5.11)
WBC: 8.2 10*3/uL (ref 4.5–10.5)

## 2010-05-07 MED ORDER — AMLODIPINE-OLMESARTAN 10-40 MG PO TABS: 1.0000 | ORAL_TABLET | Freq: Every day | ORAL | Status: DC

## 2010-05-07 NOTE — Assessment & Plan Note (Signed)
Increased fatigue with a history of anemia and recent diagnosis of hypothyroidism she has just been on the Synthroid for a little over a week and it is too early to assess the effect of the Synthroid however we will monitor a CBC today to make sure that she does not have anemia as a complicating factor to have her fatigue.

## 2010-05-07 NOTE — Assessment & Plan Note (Signed)
The patient was given a CPAP but states that she cannot sleep wearing the machine. She needs a referral back to the agency they gave her the CPAP machine to try different masks so that she can get one that she can tolerate

## 2010-05-07 NOTE — Progress Notes (Signed)
Subjective:    Patient ID: Meredith Johnson, female    DOB: 09/24/1963, 50 y.o.   MRN: [id number]  HPI  patient is a 50 year old African American female who presents for followup of her hypertension she reports a recent visit to an endocrinologist for extreme fatigue and hoarseness with a history of multinodular goiter her TSH was checked at that time which was elevated and she was started on Synthroid 100 mcg by mouth daily she has been on this medication for approximately 10 days and it is too soon to monitor her TSH and T3 24 however she does state that her hoarseness has improved and she is noticing some energy improvement.  She has a history of anemia which may contribute to her fatigue and should be monitored today.  She cannot tolerate her blood pressure medicine well he has had a history of the past of low potassium however the most recent basic metabolic panel showed a normal potassium and creatinine she is due monitoring of these parameters today.     Review of Systems  Constitutional: Negative for activity change, appetite change and fatigue.        Moderately obese  HENT: Negative for ear pain, congestion, neck pain, postnasal drip and sinus pressure.   Eyes: Negative for redness and visual disturbance.  Respiratory: Negative for cough, shortness of breath and wheezing.   Gastrointestinal: Negative for abdominal pain and abdominal distention.  Genitourinary: Negative for dysuria, frequency and menstrual problem.  Musculoskeletal: Negative for myalgias, joint swelling and arthralgias.  Skin: Negative for rash and wound.  Neurological: Negative for dizziness, weakness and headaches.  Hematological: Negative for adenopathy. Does not bruise/bleed easily.  Psychiatric/Behavioral: Negative for sleep disturbance and decreased concentration.   Past Medical History  Diagnosis Date  . Anemia   . Hypertension   . Insomnia    Past Surgical History  Procedure Date  . Panendoscopy     . Ankle surgery     1995    reports that she has never smoked. She has never used smokeless tobacco. She reports that she does not drink alcohol or use illicit drugs. family history includes Cancer in her father; Goiter in her mother; Hyperlipidemia in her mother; and Hypertension in her mother.        Objective:   Physical Exam  Constitutional: She is oriented to person, place, and time. She appears well-developed and well-nourished. No distress.        Moderately obese African American female in no apparent distress  HENT:  Head: Normocephalic and atraumatic.  Right Ear: External ear normal.  Left Ear: External ear normal.  Nose: Nose normal.  Mouth/Throat: Oropharynx is clear and moist.  Eyes: Conjunctivae and EOM are normal. Pupils are equal, round, and reactive to light.  Neck: Normal range of motion. Neck supple. No JVD present. No tracheal deviation present. No thyromegaly present.  Cardiovascular: Normal rate, regular rhythm, normal heart sounds and intact distal pulses.   No murmur heard. Pulmonary/Chest: Effort normal and breath sounds normal. She has no wheezes. She exhibits no tenderness.  Abdominal: Soft. Bowel sounds are normal.  Musculoskeletal: Normal range of motion. She exhibits no edema and no tenderness.  Lymphadenopathy:    She has no cervical adenopathy.  Neurological: She is alert and oriented to person, place, and time. She has normal reflexes. No cranial nerve deficit.  Skin: Skin is warm and dry. She is not diaphoretic.  Psychiatric: She has a normal mood and affect. Her behavior is normal.  Assessment & Plan:   blood pressures and moderately well-controlled new diagnosis of hypothyroidism added to her problem list was assessment multinodular goiter stable history of anemia anemia monitored with CBC today per problem-oriented exam

## 2010-05-07 NOTE — Assessment & Plan Note (Signed)
The patient has stable blood pressure on azor  And a basic metabolic panel will be drawn today to monitor her potassium and kidney function

## 2010-05-07 NOTE — Assessment & Plan Note (Signed)
The patient was evaluated by an endocrinologist Dr. Romero Belling for multinodular goiter was found to be hypothyroid and placed on 100 micrograms of Synthroid daily about 10 days ago

## 2010-05-15 ENCOUNTER — Telehealth: Payer: Self-pay | Admitting: Internal Medicine

## 2010-05-15 NOTE — Telephone Encounter (Signed)
Pt called req lab results. Pls call back asap. Pt has been experiencing dizziness since this a.m. Pls call today.

## 2010-05-15 NOTE — Telephone Encounter (Signed)
Pt informed potassium is low and to eat  potassium  Rich food and also thyroid is slightly abnormal and we will call tomorrow if dr Lovell Sheehan wants to change thyroid med

## 2010-05-16 ENCOUNTER — Other Ambulatory Visit: Payer: Self-pay | Admitting: *Deleted

## 2010-05-16 MED ORDER — LEVOTHYROXINE SODIUM 150 MCG PO TABS: 150.0000 ug | ORAL_TABLET | Freq: Every day | ORAL | Status: DC

## 2010-05-16 NOTE — Telephone Encounter (Signed)
Increase her synthroid to 150 mcg daily

## 2010-05-16 NOTE — Telephone Encounter (Signed)
Pt informed and med sent in 

## 2010-06-11 ENCOUNTER — Other Ambulatory Visit: Payer: Self-pay | Admitting: Endocrinology

## 2010-06-11 MED ORDER — LEVOTHYROXINE SODIUM 100 MCG PO TABS: 100.0000 ug | ORAL_TABLET | Freq: Every day | ORAL | Status: DC

## 2010-06-11 NOTE — Telephone Encounter (Signed)
R'cd fax from [facility] refill request for pt's Levothyroxine Pt is requesting 90 day supply Last OV-04/23/2010   Last Refilled-06/08/2010

## 2010-07-08 ENCOUNTER — Other Ambulatory Visit: Payer: Self-pay | Admitting: Endocrinology

## 2010-07-16 ENCOUNTER — Encounter: Payer: Self-pay | Admitting: Internal Medicine

## 2010-07-16 ENCOUNTER — Ambulatory Visit (INDEPENDENT_AMBULATORY_CARE_PROVIDER_SITE_OTHER): Payer: BC Managed Care – PPO | Admitting: Internal Medicine

## 2010-07-16 VITALS — BP 162/100 | Temp 97.9°F | Ht 65.0 in | Wt 230.0 lb

## 2010-07-16 DIAGNOSIS — I10 Essential (primary) hypertension: Secondary | ICD-10-CM

## 2010-07-16 MED ORDER — TRIAMCINOLONE ACETONIDE 0.1 % EX CREA: TOPICAL_CREAM | Freq: Two times a day (BID) | CUTANEOUS | Status: AC

## 2010-07-16 NOTE — Progress Notes (Signed)
  Subjective:    Patient ID: Meredith Johnson, female    DOB: 09/24/1964, 50 y.o.   MRN: [id number]  HPI  50 year old patient who has a history of treated hypertension. This has been well-controlled on Azor  but she has not taken her medication in 2 days. She presents today with a chief complaint of a rash involving both lower legs. This primarily involves the calves medially;  the dermatitis is quite pruritic. Has also noted a fever blister involving her right upper lip.   Review of Systems  Skin: Positive for rash.       Objective:   Physical Exam  Constitutional: She appears well-developed and well-nourished. No distress.       Blood pressure repeat 150/100  Skin: Rash noted.       Patient had a patchy dermatitis involving the medial calves bilaterally the left more prominent than the right she had raised erythematous linear lesions that were quite pruritic.          Assessment & Plan:   Nonspecific urticarial type rash lower extremities. We'll treat with topical triamcinolone and observe. Hypertension. Uncontrolled. Compliance issues stressed;  she was given samples of Azor and a dose given in the office today

## 2010-07-16 NOTE — Patient Instructions (Signed)
Triamcinolone cream as directed  Limit your sodium (Salt) intake  Please check your blood pressure on a regular basis.  If it is consistently greater than 150/90, please make an office appointment.

## 2010-07-17 ENCOUNTER — Telehealth: Payer: Self-pay | Admitting: *Deleted

## 2010-07-17 NOTE — Telephone Encounter (Signed)
Wrong pt

## 2010-07-31 ENCOUNTER — Other Ambulatory Visit (INDEPENDENT_AMBULATORY_CARE_PROVIDER_SITE_OTHER): Payer: BC Managed Care – PPO

## 2010-07-31 ENCOUNTER — Ambulatory Visit (INDEPENDENT_AMBULATORY_CARE_PROVIDER_SITE_OTHER): Payer: BC Managed Care – PPO | Admitting: Endocrinology

## 2010-07-31 ENCOUNTER — Encounter: Payer: Self-pay | Admitting: Endocrinology

## 2010-07-31 DIAGNOSIS — E890 Postprocedural hypothyroidism: Secondary | ICD-10-CM

## 2010-07-31 DIAGNOSIS — E042 Nontoxic multinodular goiter: Secondary | ICD-10-CM

## 2010-07-31 NOTE — Patient Instructions (Signed)
Let's check a thyroid ultrasound.  you will be called with a day and time for an appointment. blood tests are also being ordered for you today.  please call [phone] to hear your test results.  You will be prompted to enter the 9-digit "MRN" number that appears at the top left of this page, followed by #.  Then you will hear the message. Please make a follow-up appointment in 6 months.

## 2010-07-31 NOTE — Progress Notes (Signed)
  Subjective:    Patient ID: Meredith Johnson, female    DOB: 09/24/1964, 50 y.o.   MRN: [id number]  HPI pt is 8 mos s/p i-131 rx for hyperthyroidism, due to a multinodular goiter.  pt states she feels well in general. She takes synthroid as rx'ed.   Past Medical History  Diagnosis Date  . Anemia   . Hypertension   . Insomnia   . ANEMIA-NOS 11/27/2006  . BACK PAIN, LUMBAR 09/22/2009  . BENIGN NEOPLASM OTH&UNSPEC SITE DIGESTIVE SYSTEM 03/19/2006  . GOITER, MULTINODULAR 08/03/2009  . HEMORRHOIDS 06/20/2007  . HIATAL HERNIA 03/19/2006  . HYPERTENSION 11/27/2006  . Hypothyroidism 05/07/2010  . 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 PLANTAR FASCIITIS, RIGHT 07/13/2009  . SLEEP APNEA 06/20/2007  . Insomnia     Past Surgical History  Procedure Date  . Panendoscopy   . Ankle surgery     1995    History   Social History  . Marital Status: Single    Spouse Name: N/A    Number of Children: N/A  . Years of Education: N/A   Occupational History  . processor     [employer]   Social History Main Topics  . Smoking status: Never Smoker   . Smokeless tobacco: Never Used  . Alcohol Use: No  . Drug Use: No  . Sexually Active: Yes   Other Topics Concern  . Not on file   Social History Narrative  . No narrative on file    Current Outpatient Prescriptions on File Prior to Visit  Medication Sig Dispense Refill  . amLODipine-olmesartan (AZOR) 10-40 MG per tablet Take 1 tablet by mouth daily.      Marland Kitchen levothyroxine (SYNTHROID, LEVOTHROID) 100 MCG tablet Take 1 tablet daily  30 tablet  2  . triamcinolone (KENALOG) 0.1 % cream Apply topically 2 (two) times daily.  30 g  1    No Known Allergies  Family History  Problem Relation Age of Onset  . Goiter Mother     Resuction of a benign goiter  . Hyperlipidemia Mother   . Hypertension Mother   . Cancer Father     BP 128/82  Pulse 101  Temp(Src) 98.2 F (36.8 C) (Oral)  Ht 5\' 5"  (1.651 m)  Wt 231 lb 3.2 oz (104.872 kg)  BMI  38.47 kg/m2  SpO2 97%    Review of Systems No weight change.     Objective:   Physical Exam GENERAL: no distress Neck:  i do not appreciate any goiter    Lab Results  Component Value Date   TSH 4.53 07/31/2010     Assessment & Plan:  Multinodular goiter, apparently smaller. Post-i-131 hypothyroidism.  Well-replaced

## 2010-08-03 NOTE — Assessment & Plan Note (Signed)
HEALTHCARE                         GASTROENTEROLOGY OFFICE NOTE   Meredith Johnson, Meredith Johnson                      MRN:          [id number]  DATE:03/17/2006                            DOB:          09/24/1963    Meredith Johnson is a 50 year old, black female referred by Dr. Lovell Sheehan for  evaluation of lower abdominal pain and constipation.   I have previously seen Meredith Johnson for acid reflux going back to 1985,  but have not seen her in many years.  She has been followed by Dr.  Lovell Sheehan for sleep apnea and mild essential hypertension.  She is  referred today because of an episode of right lower quadrant pain  requiring an emergency room visit to [facility], February 13, 2006.  At that time, she was seen by the emergency room physician's assistant  and had a normal CBC except for a mild anemia with a hemoglobin of 12.  Urine pregnancy test and urinalysis were negative.  Basic metabolic  profile was normal.  She had a KUB which was normal and she was treated  for musculoskeletal pain with Flexeril.  She actually at that time was  seen by Dr. Quita Skye.   The patient's abdominal pain seems subsided but she continues to have  constipation requiring laxative use.  Her appetite is good and her  weight is stable.  She denies any specific food intolerances.  She has  had rather marked hoarseness which has required her to be on Protonix  for the last several months.  She denies typical reflux symptoms or any  hepatobiliary complaints.  She denies melena or hematochezia.   PAST MEDICAL HISTORY:  Otherwise noncontributory except for essential  hypertension and sleep apnea.  She does use a CPAP machine without any  demonstrable benefit subjectively.   FAMILY HISTORY:  Remarkable for prostate cancer in her father, diabetes,  and her mother has colon polyps.  The patient's last colonoscopy exam  because of an aunt with colon cancer was done in August 2005.   SOCIAL HISTORY:  The patient is single and has 2 children.  She has an  MBA degree.  She does not smoke or abuse ethanol.   REVIEW OF SYSTEMS:  Noncontributory, except for the patient's hoarseness  and constipation.  She denies any history of known endocrine or  neuromuscular problems or psychiatric difficulties.  She denies over-the-  counter drug use.   The patient's medications at this time are:  1. Norvasc 10 mg daily.  2. Protonix 40 mg daily.   EXAMINATION:  She is a healthy appearing, black female appearing her  stated age in no acute distress.  She is 5 feet 5 inches tall and weighs 219 pounds.  Blood pressure is  160/98.  Pulse was 80 and regular.  I could not appreciate stigmata of  chronic liver disease.  CHEST:  Clear.  There were no murmurs, gallops, or rubs noted.  She was  on a regular cardiac rhythm.  I could not appreciate hepatosplenomegaly, abdominal masses, or  tenderness.  Bowel sounds were normal.  Peripheral extremities were unremarkable.  MENTAL STATUS:  Normal.  RECTAL:  Unremarkable, except for some small external hemorrhoids.  There are no rectal masses or tenderness and stool was trace guaiac  positive.   ASSESSMENT:  1. Family history of colon carcinoma with negative colonoscopy 2 years      ago.  However, this patient does have guaiac-positive stools and      constipation.  He needs a followup colonoscopy examination.  2. Chronic functional constipation.  3. Probable acid reflux with associated hoarseness.   RECOMMENDATIONS:  1. Check repeat lab tests and iron levels.  2. Check thyroid function tests and liver function tests.  3. Reflux regime and will increase Protonix to 40 mg twice daily.  4. High-fiber diet as tolerated with liberal p.o. fluids.  5. A trial of Amitiza 24 mcg twice daily.  We have asked her to take      this with meals to decrease the nausea of side effect profile.  6. Outpatient endoscopy and colonoscopy at her convenience.   7. Continue other medications as per Dr. Lovell Sheehan.     Vania Rea. Jarold Motto, MD, Caleen Essex, FAGA  Electronically Signed    DRP/MedQ  DD: 03/17/2006  DT: 03/17/2006  Job #: [id number]   cc:   Dr. Lovell Sheehan

## 2010-08-03 NOTE — Procedures (Signed)
[name], Meredith Johnson               ACCOUNT NO.:  [id number]   MEDICAL RECORD NO.:  [id number]          PATIENT TYPE:  OUT   LOCATION:  SLEEP CENTER                 FACILITY:  [facility]   PHYSICIAN:  Marcelyn Bruins, M.D. [facility] DATE OF BIRTH:  09/24/1963   DATE OF STUDY:  01/02/2004                              NOCTURNAL POLYSOMNOGRAM   REFERRING PHYSICIAN:  Dr. Marcelyn Bruins.   INDICATIONS FOR SURGERY:  Hypersomnia with sleep apnea.  Epworth score is 5.   SLEEP ARCHITECTURE:  The patient had a total sleep time of 290 minutes with  a sleep efficiency of 80%.  There was decreased REM and the patient never  achieved slow wave sleep.  Sleep onset latency and REM latency were normal.   IMPRESSION:  1.  Severe obstructive sleep apnea/hypopnea syndrome with a respiratory      disturbance index of 51 events per hour and oxygen desaturation as low      as 79%.  Events were not positional but clearly worse during REM.  2.  Loud to very loud snoring noted throughout the study.  3.  No clinically significant cardiac arrhythmias.      KC/MEDQ  D:  01/18/2004 12:44:13  T:  01/18/2004 15:04:43  Job:  [id number]

## 2010-08-03 NOTE — Procedures (Signed)
[name], [name]               ACCOUNT NO.:  [id number]   MEDICAL RECORD NO.:  [id number]          PATIENT TYPE:  OUT   LOCATION:  SLEEP CENTER                 FACILITY:  [facility]   PHYSICIAN:  Marcelyn Bruins, M.D. [facility] DATE OF BIRTH:  09/24/1963   DATE OF STUDY:  05/07/2004                              NOCTURNAL POLYSOMNOGRAM   REFERRING PHYSICIAN:  Marcelyn Bruins, MD   INDICATION FOR STUDY:  Prior NPSG revealing severe obstructive sleep apnea.  The patient returns for pressure optimization.   SLEEP ARCHITECTURE:  The patient had total sleep time of 339 minutes with  sleep efficiency of 86%. There was decreased slow wave sleep as well as REM.  Sleep onset latency was normal as was REM onset.   IMPRESSION:  1.  Excellent control of previously diagnosed severe obstructive sleep apnea      with 15 cm of C-PAP. Pressure was titrated for both obstructive events      and snoring. The patient's Swift nasal mask was changed to a comfort gel      medium mask due to patient discomfort. She seemed to tolerate the new      mask very well.  2.  No clinically significant cardiac arrhythmias.      KC/MEDQ  D:  05/11/2004 11:37:04  T:  05/12/2004 14:53:31  Job:  [id number]

## 2010-08-06 ENCOUNTER — Ambulatory Visit: Payer: BC Managed Care – PPO | Admitting: Internal Medicine

## 2010-08-07 ENCOUNTER — Ambulatory Visit
Admission: RE | Admit: 2010-08-07 | Discharge: 2010-08-07 | Disposition: A | Discharge status: Home / Self Care | Payer: BC Managed Care – PPO | Source: Ambulatory Visit | Attending: Endocrinology | Admitting: Endocrinology

## 2010-08-07 DIAGNOSIS — E042 Nontoxic multinodular goiter: Secondary | ICD-10-CM

## 2010-08-09 ENCOUNTER — Telehealth: Payer: Self-pay | Admitting: *Deleted

## 2010-08-09 NOTE — Telephone Encounter (Signed)
Pt informed of Lab Results

## 2010-08-09 NOTE — Telephone Encounter (Signed)
Message copied by Brenton Grills on Thu Aug 09, 2010  4:32 PM ------      Message from: Cristy Hilts F      Created: Thu Aug 09, 2010 12:57 PM                   ----- Message -----         From: Minus Breeding, MD         Sent: 07/31/2010   5:53 PM           To: Brenton Grills            please leave message on phone tree--normal

## 2010-08-15 ENCOUNTER — Emergency Department (INDEPENDENT_AMBULATORY_CARE_PROVIDER_SITE_OTHER): Payer: BC Managed Care – PPO

## 2010-08-15 ENCOUNTER — Emergency Department (HOSPITAL_BASED_OUTPATIENT_CLINIC_OR_DEPARTMENT_OTHER)
Admission: EM | Admit: 2010-08-15 | Discharge: 2010-08-15 | Disposition: A | Discharge status: Home / Self Care | Payer: BC Managed Care – PPO | Attending: Emergency Medicine | Admitting: Emergency Medicine

## 2010-08-15 DIAGNOSIS — R0789 Other chest pain: Secondary | ICD-10-CM

## 2010-08-15 DIAGNOSIS — E039 Hypothyroidism, unspecified: Secondary | ICD-10-CM | POA: Insufficient documentation

## 2010-08-15 DIAGNOSIS — K219 Gastro-esophageal reflux disease without esophagitis: Secondary | ICD-10-CM | POA: Insufficient documentation

## 2010-08-15 DIAGNOSIS — R079 Chest pain, unspecified: Secondary | ICD-10-CM | POA: Insufficient documentation

## 2010-08-15 DIAGNOSIS — I10 Essential (primary) hypertension: Secondary | ICD-10-CM | POA: Insufficient documentation

## 2010-08-15 LAB — CBC
HCT: 34.4 % — ABNORMAL LOW (ref 36.0–46.0)
Hemoglobin: 11.8 g/dL — ABNORMAL LOW (ref 12.0–15.0)
MCH: 30.2 pg (ref 26.0–34.0)
MCV: 88 fL (ref 78.0–100.0)
RBC: 3.91 MIL/uL (ref 3.87–5.11)

## 2010-08-15 LAB — DIFFERENTIAL
Basophils Relative: 0 % (ref 0–1)
Lymphocytes Relative: 41 % (ref 12–46)
Lymphs Abs: 3.4 10*3/uL (ref 0.7–4.0)
Monocytes Absolute: 0.5 10*3/uL (ref 0.1–1.0)
Monocytes Relative: 6 % (ref 3–12)
Neutro Abs: 4.3 10*3/uL (ref 1.7–7.7)
Neutrophils Relative %: 51 % (ref 43–77)

## 2010-08-15 LAB — BASIC METABOLIC PANEL
BUN: 12 mg/dL (ref 6–23)
Chloride: 102 mEq/L (ref 96–112)
Creatinine, Ser: 0.6 mg/dL (ref 0.4–1.2)
Glucose, Bld: 91 mg/dL (ref 70–99)

## 2010-08-15 LAB — CK TOTAL AND CKMB (NOT AT ARMC): Total CK: 214 U/L — ABNORMAL HIGH (ref 7–177)

## 2010-08-15 LAB — TROPONIN I: Troponin I: <0.3 ng/mL (ref <0.30)

## 2010-08-29 ENCOUNTER — Telehealth: Payer: Self-pay | Admitting: *Deleted

## 2010-08-29 NOTE — Telephone Encounter (Signed)
Pt called stating she has been seeing different doctors since she cannot get in to see Dr. Lovell Sheehan for allergic reactions.......rash on legs, face, blisters in mouth.  Was taken off Azor and put back on Norvasc by an Urgent Care MD.  Wants to see Dr. Lovell Sheehan today, and was offered one of the other partners.  She announced she would find a different doctor and hung up.

## 2010-08-31 ENCOUNTER — Other Ambulatory Visit: Payer: Self-pay | Admitting: *Deleted

## 2010-09-03 ENCOUNTER — Encounter: Payer: Self-pay | Admitting: Internal Medicine

## 2010-09-03 ENCOUNTER — Ambulatory Visit (INDEPENDENT_AMBULATORY_CARE_PROVIDER_SITE_OTHER): Payer: BC Managed Care – PPO | Admitting: Internal Medicine

## 2010-09-03 VITALS — BP 160/100 | HR 76 | Temp 98.2°F | Resp 16 | Ht 65.0 in | Wt 228.0 lb

## 2010-09-03 DIAGNOSIS — I10 Essential (primary) hypertension: Secondary | ICD-10-CM

## 2010-09-03 MED ORDER — ATENOLOL-CHLORTHALIDONE 50-25 MG PO TABS: 1.0000 | ORAL_TABLET | Freq: Every day | ORAL | Status: DC

## 2010-09-03 NOTE — Patient Instructions (Signed)
You'll now be on 3 drugs for your blood pressure but to be combined into one 1 drug would be a beta blocker and a fluid pill  The other will be. the Norvasc he taken at 10 mg once day The fluid pill be the same as your taking now but will be combined with a beta blocker to control his blood pressure better  Sometimes people feel little tired from a starting blood pressure better since the don't worry if you feel little tired  If any more rashes appear call the office and I will see her back in 4 weeks

## 2010-09-03 NOTE — Progress Notes (Signed)
  Subjective:    Patient ID: Meredith Johnson, female    DOB: 09/24/1964, 50 y.o.   MRN: [id number]  HPI Patient is a 50 her old [place] American female who has hard to control blood pressure but has been on a sore for blood pressure control.  Recently she has developed a transient coronary swelling and rash in different areas of the body including on her legs face and lips and tongue swelling. The patient has been off of the Azor since June 7. She has not noticed any rashes but did have some lip swelling Her blood pressure is moderately elevated  Review of Systems  Constitutional: Negative for activity change, appetite change and fatigue.  HENT: Negative for ear pain, congestion, neck pain, postnasal drip and sinus pressure.   Eyes: Negative for redness and visual disturbance.  Respiratory: Negative for cough, shortness of breath and wheezing.   Gastrointestinal: Negative for abdominal pain and abdominal distention.  Genitourinary: Negative for dysuria, frequency and menstrual problem.  Musculoskeletal: Negative for myalgias, joint swelling and arthralgias.  Skin: Negative for rash and wound.  Neurological: Negative for dizziness, weakness and headaches.  Hematological: Negative for adenopathy. Does not bruise/bleed easily.  Psychiatric/Behavioral: Negative for sleep disturbance and decreased concentration.       Objective:   Physical Exam  Constitutional: She is oriented to person, place, and time. She appears well-developed and well-nourished. No distress.  HENT:  Head: Normocephalic and atraumatic.  Right Ear: External ear normal.  Left Ear: External ear normal.  Nose: Nose normal.  Mouth/Throat: Oropharynx is clear and moist.  Eyes: Conjunctivae and EOM are normal. Pupils are equal, round, and reactive to light.  Neck: Normal range of motion. Neck supple. No JVD present. No tracheal deviation present. No thyromegaly present.  Cardiovascular: Normal rate, regular rhythm, normal  heart sounds and intact distal pulses.   No murmur heard. Pulmonary/Chest: Effort normal and breath sounds normal. She has no wheezes. She exhibits no tenderness.  Abdominal: Soft. Bowel sounds are normal.  Musculoskeletal: Normal range of motion. She exhibits no edema and no tenderness.  Lymphadenopathy:    She has no cervical adenopathy.  Neurological: She is alert and oriented to person, place, and time. She has normal reflexes. No cranial nerve deficit.  Skin: Skin is warm and dry. She is not diaphoretic.  Psychiatric: She has a normal mood and affect. Her behavior is normal.          Assessment & Plan:   may be sensitivity to the ACE or ARB class of drugs We'll try atenolol hydrochlorothiazide and continue the Norvasc skillful careful monitoring over the next 4 weeks return in 4 weeks

## 2010-09-29 ENCOUNTER — Other Ambulatory Visit: Payer: Self-pay | Admitting: Internal Medicine

## 2010-10-01 ENCOUNTER — Other Ambulatory Visit: Payer: Self-pay | Admitting: *Deleted

## 2010-10-01 ENCOUNTER — Ambulatory Visit: Payer: BC Managed Care – PPO | Admitting: Internal Medicine

## 2010-10-01 NOTE — Telephone Encounter (Signed)
Dr. Jenkins pt. 

## 2010-12-17 ENCOUNTER — Other Ambulatory Visit: Payer: Self-pay | Admitting: *Deleted

## 2010-12-17 ENCOUNTER — Telehealth: Payer: Self-pay | Admitting: Internal Medicine

## 2010-12-17 MED ORDER — LEVOTHYROXINE SODIUM 100 MCG PO TABS: 100.0000 ug | ORAL_TABLET | Freq: Every day | ORAL | Status: DC

## 2010-12-17 NOTE — Telephone Encounter (Signed)
Pt is calling back stating she needs her Synthroid, and she has been waiting a week?

## 2010-12-17 NOTE — Telephone Encounter (Signed)
Med has dr ellison's name therefore refill has probably been going there- this is the first day we have received any request for refil  Refilled times 3 today and .lmom For ptl

## 2010-12-17 NOTE — Telephone Encounter (Signed)
Refill Synthroid to [facility] on [address]. Thanks. Records shows Dr Everardo All giving her this. But she says that the pharmacy has been "calling for a week". Please advise. Thanks.

## 2011-01-21 ENCOUNTER — Ambulatory Visit (INDEPENDENT_AMBULATORY_CARE_PROVIDER_SITE_OTHER): Payer: BC Managed Care – PPO | Admitting: Internal Medicine

## 2011-01-21 ENCOUNTER — Encounter: Payer: Self-pay | Admitting: Internal Medicine

## 2011-01-21 DIAGNOSIS — E039 Hypothyroidism, unspecified: Secondary | ICD-10-CM

## 2011-01-21 DIAGNOSIS — I10 Essential (primary) hypertension: Secondary | ICD-10-CM

## 2011-01-21 DIAGNOSIS — D649 Anemia, unspecified: Secondary | ICD-10-CM

## 2011-01-21 DIAGNOSIS — E042 Nontoxic multinodular goiter: Secondary | ICD-10-CM

## 2011-01-21 LAB — CBC WITH DIFFERENTIAL/PLATELET
Basophils Relative: 0.5 % (ref 0.0–3.0)
Eosinophils Relative: 1.2 % (ref 0.0–5.0)
Lymphocytes Relative: 38.9 % (ref 12.0–46.0)
MCV: 91.8 fl (ref 78.0–100.0)
Monocytes Relative: 5.7 % (ref 3.0–12.0)
Neutrophils Relative %: 53.7 % (ref 43.0–77.0)
Platelets: 330 10*3/uL (ref 150.0–400.0)
RBC: 3.95 Mil/uL (ref 3.87–5.11)
WBC: 9.1 10*3/uL (ref 4.5–10.5)

## 2011-01-21 LAB — IRON: Iron: 58 ug/dL (ref 42–145)

## 2011-01-21 LAB — BASIC METABOLIC PANEL
CO2: 31 mEq/L (ref 19–32)
Calcium: 9.3 mg/dL (ref 8.4–10.5)
GFR: 76.32 mL/min (ref >60.00)
Sodium: 145 mEq/L (ref 135–145)

## 2011-01-21 LAB — T4, FREE: Free T4: 1.09 ng/dL (ref 0.60–1.60)

## 2011-01-21 LAB — T3, FREE: T3, Free: 2.4 pg/mL (ref 2.3–4.2)

## 2011-01-21 MED ORDER — FERROUS FUMARATE 50 MG PO TBCR: 50.0000 mg | EXTENDED_RELEASE_TABLET | Freq: Two times a day (BID) | ORAL | Status: DC

## 2011-01-21 NOTE — Patient Instructions (Signed)
Please note that the patient has allergies to certain detergents and should be allowed to have her lab coat to wash at home with fragrance free detergent and no bleach.

## 2011-01-21 NOTE — Progress Notes (Signed)
Subjective:    Patient ID: Meredith Johnson, female    DOB: 09/24/1964, 50 y.o.   MRN: [id number]  HPI This 50 year old [place] American female presents for followup of hypertension and history of anemia as well as goiter with hypothyroidism.  She has not been back to see the endocrinologist  She has an acute complaint today of increasing fatigue sense of coldness or pigment over her knuckles.   Review of Systems  Constitutional: Positive for fatigue. Negative for activity change and appetite change.  HENT: Negative for ear pain, congestion, neck pain, postnasal drip and sinus pressure.   Eyes: Negative for redness and visual disturbance.  Respiratory: Negative for cough, shortness of breath and wheezing.   Gastrointestinal: Negative for abdominal pain and abdominal distention.  Genitourinary: Negative for dysuria, frequency and menstrual problem.  Musculoskeletal: Negative for myalgias, joint swelling and arthralgias.  Skin: Negative for rash and wound.  Neurological: Positive for weakness. Negative for dizziness and headaches.  Hematological: Negative for adenopathy. Does not bruise/bleed easily.  Psychiatric/Behavioral: Negative for sleep disturbance and decreased concentration.       Past Medical History  Diagnosis Date  . Anemia   . Hypertension   . Insomnia   . ANEMIA-NOS 11/27/2006  . BACK PAIN, LUMBAR 09/22/2009  . BENIGN NEOPLASM OTH&UNSPEC SITE DIGESTIVE SYSTEM 03/19/2006  . GOITER, MULTINODULAR 08/03/2009  . HEMORRHOIDS 06/20/2007  . HIATAL HERNIA 03/19/2006  . HYPERTENSION 11/27/2006  . Hypothyroidism 05/07/2010  . 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 PLANTAR FASCIITIS, RIGHT 07/13/2009  . SLEEP APNEA 06/20/2007  . Insomnia    Past Surgical History  Procedure Date  . Panendoscopy   . Ankle surgery     1995    reports that she has never smoked. She has never used smokeless tobacco. She reports that she does not drink alcohol or use illicit drugs. family history  includes Cancer in her father; Goiter in her mother; Hyperlipidemia in her mother; and Hypertension in her mother. No Known Allergies  Objective:   Physical Exam  Nursing note and vitals reviewed. Constitutional: She is oriented to person, place, and time. She appears well-developed and well-nourished. No distress.  HENT:  Head: Normocephalic and atraumatic.  Right Ear: External ear normal.  Left Ear: External ear normal.  Nose: Nose normal.  Mouth/Throat: Oropharynx is clear and moist.  Eyes: Conjunctivae and EOM are normal. Pupils are equal, round, and reactive to light.  Neck: Normal range of motion. Neck supple. No JVD present. No tracheal deviation present. No thyromegaly present.  Cardiovascular: Normal rate, regular rhythm, normal heart sounds and intact distal pulses.   No murmur heard. Pulmonary/Chest: Effort normal and breath sounds normal. She has no wheezes. She exhibits no tenderness.  Abdominal: Soft. Bowel sounds are normal.  Musculoskeletal: Normal range of motion. She exhibits no edema and no tenderness.  Lymphadenopathy:    She has no cervical adenopathy.  Neurological: She is alert and oriented to person, place, and time. She has normal reflexes. No cranial nerve deficit.  Skin: Skin is warm and dry. She is not diaphoretic.  Psychiatric: She has a normal mood and affect. Her behavior is normal.          Assessment & Plan:  Since increased fatigue with a history of hypothyroidism as well as a history of anemia.  We will check thyroid profile as well as an iron and CBC and physical examination due to the pelvis of her conjunctiva I believe the anemia is the primary diagnosis and  will presumptively put her on iron she has had constipation with iron in the past therefore we will use 50 mg iron twice daily with food. We will monitor her CBC thyroid and a basic metabolic panel today

## 2011-01-21 NOTE — Progress Notes (Signed)
Addended by: Bonnye Fava on: 01/21/2011 10:25 AM   Modules accepted: Orders

## 2011-01-25 ENCOUNTER — Other Ambulatory Visit: Payer: Self-pay | Admitting: *Deleted

## 2011-01-25 MED ORDER — LEVOTHYROXINE SODIUM 125 MCG PO TABS: 125.0000 ug | ORAL_TABLET | Freq: Every day | ORAL | Status: DC

## 2011-03-14 ENCOUNTER — Other Ambulatory Visit: Payer: Self-pay | Admitting: Internal Medicine

## 2011-03-14 DIAGNOSIS — Z1231 Encounter for screening mammogram for malignant neoplasm of breast: Secondary | ICD-10-CM

## 2011-04-02 ENCOUNTER — Ambulatory Visit: Payer: BC Managed Care – PPO

## 2011-04-03 ENCOUNTER — Telehealth: Payer: Self-pay | Admitting: *Deleted

## 2011-04-03 NOTE — Telephone Encounter (Signed)
Pt has been taking Levaquin x 2 weeks from Urgent care for pneumonia.  She now has LLQ pain (worse with movement, and is relieved by Aleve, but comes right back.  Is asking for Dr. Lovell Sheehan" recommendations.

## 2011-04-03 NOTE — Telephone Encounter (Signed)
Coughed a lot with pneumonia-doesn't hurt when taking deep breasth and no diarrhea- per dr Lovell Sheehan -sounds like maybe pulled muscle from coughing- if not relieved in a couple of weeks or if it gets worse call back

## 2011-04-15 ENCOUNTER — Ambulatory Visit
Admission: RE | Admit: 2011-04-15 | Discharge: 2011-04-15 | Disposition: A | Discharge status: Home / Self Care | Payer: BC Managed Care – PPO | Source: Ambulatory Visit | Attending: Internal Medicine | Admitting: Internal Medicine

## 2011-04-15 DIAGNOSIS — Z1231 Encounter for screening mammogram for malignant neoplasm of breast: Secondary | ICD-10-CM

## 2011-05-10 ENCOUNTER — Ambulatory Visit (INDEPENDENT_AMBULATORY_CARE_PROVIDER_SITE_OTHER): Payer: BC Managed Care – PPO

## 2011-05-10 ENCOUNTER — Ambulatory Visit (INDEPENDENT_AMBULATORY_CARE_PROVIDER_SITE_OTHER): Payer: BC Managed Care – PPO | Admitting: Family Medicine

## 2011-05-10 VITALS — BP 136/84 | HR 64 | Temp 97.9°F | Resp 18 | Ht 66.5 in | Wt 234.0 lb

## 2011-05-10 DIAGNOSIS — M25461 Effusion, right knee: Secondary | ICD-10-CM

## 2011-05-10 DIAGNOSIS — M25469 Effusion, unspecified knee: Secondary | ICD-10-CM

## 2011-05-10 DIAGNOSIS — M659 Synovitis and tenosynovitis, unspecified: Secondary | ICD-10-CM

## 2011-05-10 LAB — URIC ACID: Uric Acid, Serum: 7.2 mg/dL — ABNORMAL HIGH (ref 2.4–7.0)

## 2011-05-10 MED ORDER — DICLOFENAC SODIUM 75 MG PO TBEC: 75.0000 mg | DELAYED_RELEASE_TABLET | Freq: Two times a day (BID) | ORAL | Status: AC

## 2011-05-10 MED ORDER — MELOXICAM 7.5 MG PO TABS: 7.5000 mg | ORAL_TABLET | Freq: Two times a day (BID) | ORAL | Status: DC

## 2011-05-10 NOTE — Patient Instructions (Signed)
(  L) knee strain, ? Injury to cartilage  Voltaren 1 tablet twice daily as needed for inflammation  Walk carefully on uneven surfaces  If your knee is not significantly better in 10 days please call.

## 2011-05-10 NOTE — Progress Notes (Signed)
  Subjective:    Patient ID: Meredith Johnson, female    DOB: 09/24/1963, 51 y.o.   MRN: [id number]  HPI  Patient presents with 1 day history of (L) knee pain and swelling. Discomfort with ambulation. Patient does experience a sensation of locking. The knee does not give way.  No history of OA or inflammatory arthritis.  Patient has been walking/jogging on a treadmill and last week caught herself from falling off. She believes her knee may have twisted then.  No other trauma   Review of Systems     Objective:   Physical Exam  Constitutional: She appears well-developed and well-nourished.  Musculoskeletal:       Left knee: She exhibits swelling. She exhibits normal range of motion, no erythema, no bony tenderness and no MCL laxity. tenderness found. Medial joint line and MCL tenderness noted.       Legs:   [facility] reading (PRIMARY) by  Dr. Hal Hope Reduced joint space.. No significant degenerative changes      Assessment & Plan:   1. Swelling of joint of right knee  DG Knee Complete 4 Views Left, Uric acid, DISCONTINUED: meloxicam (MOBIC) 7.5 MG tablet  2. Synovitis of knee, concerning for meniscal injury diclofenac (VOLTAREN) 75 MG EC tablet   Anticipatory guidance.  Patient to hold on exercise program for 10 days.    Called patient 5:20 PM and left message of x-ray over read. Patient to call in 3 days and let me know how her knee is doing.

## 2011-05-11 ENCOUNTER — Telehealth: Payer: Self-pay

## 2011-05-11 NOTE — Telephone Encounter (Signed)
PT WOULD LIKE TO KNOW IF WE COULD DRAIN THE FLUID FROM HER KNEE INSTEAD OF HER TAKING THE ANTI-INFLAMMATORY MEDICATION THAT SHE WAS PRESCRIBED

## 2011-05-12 ENCOUNTER — Ambulatory Visit (INDEPENDENT_AMBULATORY_CARE_PROVIDER_SITE_OTHER): Payer: BC Managed Care – PPO | Admitting: Family Medicine

## 2011-05-12 VITALS — BP 135/73 | HR 86 | Temp 98.0°F | Resp 16 | Ht 66.0 in | Wt 227.6 lb

## 2011-05-12 DIAGNOSIS — B001 Herpesviral vesicular dermatitis: Secondary | ICD-10-CM

## 2011-05-12 DIAGNOSIS — B009 Herpesviral infection, unspecified: Secondary | ICD-10-CM

## 2011-05-12 DIAGNOSIS — T7840XA Allergy, unspecified, initial encounter: Secondary | ICD-10-CM

## 2011-05-12 DIAGNOSIS — M25569 Pain in unspecified knee: Secondary | ICD-10-CM

## 2011-05-12 MED ORDER — VALACYCLOVIR HCL 500 MG PO TABS: 500.0000 mg | ORAL_TABLET | Freq: Three times a day (TID) | ORAL | Status: AC

## 2011-05-12 MED ORDER — PREDNISONE 20 MG PO TABS: 20.0000 mg | ORAL_TABLET | Freq: Every day | ORAL | Status: AC

## 2011-05-12 NOTE — Telephone Encounter (Signed)
[facility] notifying patient that we would like to avoid having to drain her knee since this could possibly increase her risk of introducing infection.  Advised continuing meds as advised, and recheck if no improvement.  CB if any ?'s.

## 2011-05-12 NOTE — Telephone Encounter (Signed)
Agree with advice given

## 2011-05-12 NOTE — Progress Notes (Signed)
Patient presents with 3 day history of (L) knee pain and swelling.  Discomfort with ambulation. Pain is feeling better Patient does experience a sensation of locking. The knee does not give way.  No history of OA or inflammatory arthritis.  Patient has been walking/jogging on a treadmill and last week caught herself from falling off.  She believes her knee may have twisted then. No other trauma   Left upper lip swelled up yesterday morning after starting the Voltaren  O:  Normal knee exam with no effusion, FROM, nontender, no lig laxity, negative grind test  Upper lip swollen with vessicles  A:  Cold sore, knee inflammation  P:  Valtrex 500 tid x 7 Prednisone 20 mg 1 daily x 5 days.

## 2011-05-17 ENCOUNTER — Ambulatory Visit: Payer: BC Managed Care – PPO | Admitting: Internal Medicine

## 2011-10-09 ENCOUNTER — Other Ambulatory Visit: Payer: Self-pay | Admitting: Internal Medicine

## 2011-10-14 ENCOUNTER — Other Ambulatory Visit: Payer: Self-pay | Admitting: *Deleted

## 2011-10-14 MED ORDER — ATENOLOL-CHLORTHALIDONE 50-25 MG PO TABS: 1.0000 | ORAL_TABLET | Freq: Every day | ORAL | Status: DC

## 2012-02-28 ENCOUNTER — Ambulatory Visit (INDEPENDENT_AMBULATORY_CARE_PROVIDER_SITE_OTHER): Payer: BC Managed Care – PPO | Admitting: Family Medicine

## 2012-02-28 ENCOUNTER — Encounter: Payer: Self-pay | Admitting: Family Medicine

## 2012-02-28 VITALS — BP 140/80 | HR 83 | Temp 98.3°F | Wt 224.0 lb

## 2012-02-28 DIAGNOSIS — J069 Acute upper respiratory infection, unspecified: Secondary | ICD-10-CM

## 2012-02-28 DIAGNOSIS — J329 Chronic sinusitis, unspecified: Secondary | ICD-10-CM

## 2012-02-28 MED ORDER — BENZONATATE 100 MG PO CAPS: 100.0000 mg | ORAL_CAPSULE | Freq: Three times a day (TID) | ORAL | Status: DC | PRN

## 2012-02-28 MED ORDER — FLUTICASONE PROPIONATE 50 MCG/ACT NA SUSP: 2.0000 | Freq: Every day | NASAL | Status: DC

## 2012-02-28 NOTE — Progress Notes (Signed)
Chief Complaint  Patient presents with  . Cough    discomfort in chest when coughing started yesterday     HPI: -started: yesterday -symptoms:nasal congestion, sore throat, cough -denies:fever, SOB, NVD, tooth pain, strep, flu or mono exposure -has tried: nothing -sick contacts: grandson sick -Hx of: bronchitis and pneumonia   ROS: See pertinent positives and negatives per HPI.  Past Medical History  Diagnosis Date  . Anemia   . Hypertension   . Insomnia   . ANEMIA-NOS 11/27/2006  . BACK PAIN, LUMBAR 09/22/2009  . BENIGN NEOPLASM OTH&UNSPEC SITE DIGESTIVE SYSTEM 03/19/2006  . GOITER, MULTINODULAR 08/03/2009  . HEMORRHOIDS 06/20/2007  . HIATAL HERNIA 03/19/2006  . HYPERTENSION 11/27/2006  . Hypothyroidism 05/07/2010  . 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 PLANTAR FASCIITIS, RIGHT 07/13/2009  . SLEEP APNEA 06/20/2007  . Insomnia     Family History  Problem Relation Age of Onset  . Goiter Mother     Resuction of a benign goiter  . Hyperlipidemia Mother   . Hypertension Mother   . Cancer Father     History   Social History  . Marital Status: Single    Spouse Name: N/A    Number of Children: N/A  . Years of Education: N/A   Occupational History  . processor     [employer]   Social History Main Topics  . Smoking status: Never Smoker   . Smokeless tobacco: Never Used  . Alcohol Use: No  . Drug Use: No  . Sexually Active: Yes   Other Topics Concern  . None   Social History Narrative  . None    Current outpatient prescriptions:amLODipine (NORVASC) 10 MG tablet, TAKE 1 TABLET DAILY, Disp: 30 tablet, Rfl: 4;  atenolol-chlorthalidone (TENORETIC 50) 50-25 MG per tablet, Take 1 tablet by mouth daily., Disp: 30 tablet, Rfl: 5;  atenolol-chlorthalidone (TENORETIC) 50-25 MG per tablet, Take 1 tablet by mouth daily., Disp: , Rfl: ;  ferrous fumarate (FERRO-SEQUELS) 50 MG CR tablet, Take 50 mg by mouth 2 (two) times daily with a meal., Disp: , Rfl:   levothyroxine (SYNTHROID, LEVOTHROID) 125 MCG tablet, Take 125 mcg by mouth daily., Disp: , Rfl: ;  valACYclovir (VALTREX) 500 MG tablet, Take 1 tablet (500 mg total) by mouth 3 (three) times daily., Disp: 21 tablet, Rfl: 0;  benzonatate (TESSALON PERLES) 100 MG capsule, Take 1 capsule (100 mg total) by mouth 3 (three) times daily as needed for cough., Disp: 20 capsule, Rfl: 0 diclofenac (VOLTAREN) 75 MG EC tablet, Take 1 tablet (75 mg total) by mouth 2 (two) times daily., Disp: 30 tablet, Rfl: 0;  fluticasone (FLONASE) 50 MCG/ACT nasal spray, Place 2 sprays into the nose daily., Disp: 16 g, Rfl: 6  EXAM:  Filed Vitals:   02/28/12 1044  BP: 140/80  Pulse: 83  Temp: 98.3 F (36.8 C)    There is no height on file to calculate BMI.  GENERAL: vitals reviewed and listed above, alert, oriented, appears well hydrated and in no acute distress  HEENT: atraumatic, conjunttiva clear, no obvious abnormalities on inspection of external nose and ears, normal appearance of ear canals and TMs, clear nasal congestion, mild post oropharyngeal erythema with PND, no tonsillar edema or exudate, no sinus TTP  NECK: no obvious masses on inspection  LUNGS: clear to auscultation bilaterally, no wheezes, rales or rhonchi, good air movement  CV: HRRR, no peripheral edema  MS: moves all extremities without noticeable abnormality  PSYCH: pleasant and cooperative, no obvious depression or  anxiety  ASSESSMENT AND PLAN:  Discussed the following assessment and plan:  1. Rhinosinusitis  fluticasone (FLONASE) 50 MCG/ACT nasal spray  2. Viral upper respiratory illness  benzonatate (TESSALON PERLES) 100 MG capsule   -supportive care -Patient advised to return or notify a doctor immediately if symptoms worsen or persist or new concerns arise.  Patient Instructions  INSTRUCTIONS FOR UPPER RESPIRATORY INFECTION:  -plenty of rest and fluids  -nasal saline wash 2-3 times daily (use prepackaged nasal saline or  bottled/distilled water if making your own)   -can use sinex nasal spray for drainage and nasal congestion - but do NOT use longer then 3-4 days  -can use tylenol or ibuprofen as directed for aches and sorethroat  -in the winter time, using a humidifier at night is helpful (please follow cleaning instructions)  -if you are taking a cough medication - use only as directed, may also try a teaspoon of honey to coat the throat and throat lozenges  -for sore throat, salt water gargles can help  -follow up if you have fevers, facial pain, tooth pain, difficulty breathing or are worsening or not getting better in 5-7 days      Dorise Gangi R.

## 2012-02-28 NOTE — Patient Instructions (Addendum)

## 2012-04-02 ENCOUNTER — Other Ambulatory Visit: Payer: Self-pay | Admitting: Internal Medicine

## 2012-04-02 ENCOUNTER — Other Ambulatory Visit: Payer: Self-pay | Admitting: *Deleted

## 2013-01-12 ENCOUNTER — Other Ambulatory Visit: Payer: Self-pay

## 2013-01-12 DIAGNOSIS — Z1231 Encounter for screening mammogram for malignant neoplasm of breast: Secondary | ICD-10-CM

## 2013-02-10 ENCOUNTER — Ambulatory Visit
Admission: RE | Admit: 2013-02-10 | Discharge: 2013-02-10 | Disposition: A | Discharge status: Home / Self Care | Payer: BC Managed Care – PPO | Source: Ambulatory Visit

## 2013-02-10 DIAGNOSIS — Z1231 Encounter for screening mammogram for malignant neoplasm of breast: Secondary | ICD-10-CM

## 2013-07-21 ENCOUNTER — Telehealth: Payer: Self-pay | Admitting: Gastroenterology

## 2013-07-21 NOTE — Telephone Encounter (Signed)
Left a message for patient to call me. 

## 2013-07-21 NOTE — Telephone Encounter (Signed)
Normal colon 2008, but apparently has family hx of colon cancer- so  would be time for  Followup... Can establish with Ardis Hughs or Pyrtle

## 2013-07-21 NOTE — Telephone Encounter (Signed)
Spoke with patient and she wants to check her schedule and call back to schedule an OV to meet either Dr. Ardis Hughs or Dr. Hilarie Fredrickson.

## 2013-07-21 NOTE — Telephone Encounter (Signed)
Spoke with patient and she does not have a preference for new GI MD. She is not having any problems. She is asking when she should have a repeat colonoscopy. Please, advise.

## 2013-08-12 ENCOUNTER — Telehealth: Payer: Self-pay | Admitting: Gastroenterology

## 2013-08-12 NOTE — Telephone Encounter (Signed)
Spoke with patient and she reports BRB in stool x 2 today. Denies abdominal pain. Scheduled with Tye Savoy, NP on 08/13/13 at 3:00 PM.

## 2013-08-13 ENCOUNTER — Encounter: Payer: Self-pay | Admitting: Nurse Practitioner

## 2013-08-13 ENCOUNTER — Ambulatory Visit (INDEPENDENT_AMBULATORY_CARE_PROVIDER_SITE_OTHER): Payer: BC Managed Care – PPO | Admitting: Nurse Practitioner

## 2013-08-13 VITALS — BP 158/90 | HR 80 | Ht 66.0 in | Wt 237.0 lb

## 2013-08-13 DIAGNOSIS — K648 Other hemorrhoids: Secondary | ICD-10-CM

## 2013-08-13 DIAGNOSIS — K649 Unspecified hemorrhoids: Secondary | ICD-10-CM

## 2013-08-13 DIAGNOSIS — K625 Hemorrhage of anus and rectum: Secondary | ICD-10-CM

## 2013-08-13 MED ORDER — HYDROCORTISONE ACETATE 25 MG RE SUPP: 25.0000 mg | Freq: Two times a day (BID) | RECTAL | Status: AC

## 2013-08-13 NOTE — Patient Instructions (Signed)
We sent a prescription to [facility] [address] for Anusol HC Suppositories. Continue the [employer].  After you finish the suppositories after 7 days call us with a condition update.  Ask for a nurse and let them know you saw Tye Savoy NP-C.

## 2013-08-14 ENCOUNTER — Encounter: Payer: Self-pay | Admitting: Nurse Practitioner

## 2013-08-14 DIAGNOSIS — K625 Hemorrhage of anus and rectum: Secondary | ICD-10-CM | POA: Insufficient documentation

## 2013-08-14 NOTE — Progress Notes (Addendum)
HPI :  Patient is a 53 year old female here for evaluation of rectal bleeding. She had a normal complete colonoscopy January 2008 by Dr. Sharlett Iles for evaluation of heme positive stool.   Yesterday patient saw blood when wiping after BM. A few times during the day she found some blood in undergarment. Bleeding was painless. Patient gets occasional constipation and does recall being constipated last week. Patient normally drinks a daily Kale shake to stay regular. She skip a few days and got constipation. She did had some abdominal discomfort yesterday. Description of pain is vague, she isn't exactly sure where pain was located. No associated nausea. No weight loss  Past Medical History  Diagnosis Date  . Anemia   . Hypertension   . Insomnia   . ANEMIA-NOS 11/27/2006  . BACK PAIN, LUMBAR 09/22/2009  . BENIGN NEOPLASM [place] SITE DIGESTIVE SYSTEM 03/19/2006  . GOITER, MULTINODULAR 08/03/2009  . HEMORRHOIDS 06/20/2007  . HIATAL HERNIA 03/19/2006  . HYPERTENSION 11/27/2006  . Hypothyroidism 05/07/2010  . 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 PLANTAR FASCIITIS, RIGHT 07/13/2009  . SLEEP APNEA 06/20/2007    Family History  Problem Relation Age of Onset  . Goiter Mother     Resuction of a benign goiter  . Hyperlipidemia Mother   . Hypertension Mother   . Cancer Father    History  Substance Use Topics  . Smoking status: Never Smoker   . Smokeless tobacco: Never Used  . Alcohol Use: No   Current Outpatient Prescriptions  Medication Sig Dispense Refill  . amLODipine (NORVASC) 10 MG tablet TAKE 1 TABLET BY MOUTH EVERY DAY  30 tablet  5  . levothyroxine (SYNTHROID, LEVOTHROID) 125 MCG tablet Take 125 mcg by mouth daily.      . hydrocortisone (ANUSOL-HC) 25 MG suppository Place 1 suppository (25 mg total) rectally every 12 (twelve) hours.  7 suppository  1   No current facility-administered medications for this visit.   Allergies  Allergen Reactions  . Diclofenac Sodium   . Azor  [Amlodipine-Olmesartan]     Review of Systems: All systems reviewed and negative except where noted in HPI.   Physical Exam: BP 158/90  Pulse 80  Ht 5\' 6"  (1.676 m)  Wt 237 lb (107.502 kg)  BMI 38.27 kg/m2 Constitutional: Pleasant,obese black female in no acute distress. HEENT: Normocephalic and atraumatic. Conjunctivae are normal. No scleral icterus. Neck supple.  Cardiovascular: Normal rate, regular rhythm.  Pulmonary/chest: Effort normal and breath sounds normal. No wheezing, rales or rhonchi. Abdominal: Soft, obese, nondistended, nontender. Bowel sounds active throughout. There are no masses palpable. No hepatomegaly. Rectal: hemorrhoid tags. Light brown stool in vault. On anoscopy mildly inflamed hemorrhoids seen.  Extremities: no edema Lymphadenopathy: No cervical adenopathy noted. Neurological: Alert and oriented to person place and time. Skin: Skin is warm and dry. No rashes noted. Psychiatric: Normal mood and affect. Behavior is normal.   ASSESSMENT AND PLAN:  53. year old female with self-limited rectal bleeding yesterday. She did have some vague abdominal discomfort yesterday but not necessarily related to the bleeding. Patient recalls being constipated last week after not taking Kale smoothie.. She does have internal hemorrhoids on anoscopy today. Suspect bleeding secondary to internal hemorrhoids. I don't think she needs repeat colonoscopy yet. Will first try course of steroid suppositories. Avoid constipation. Patient will call if she has recurrent bleeding despite these measures.Should this be this cause we can discuss hemorrhoid banding and / or repeat colonoscopy.   2. Chronic constipation. Meredith Johnson  smoothies keep her bowels moving regularly. .    Addendum: Reviewed and agree with initial management. Jerene Bears, MD

## 2014-04-01 ENCOUNTER — Other Ambulatory Visit: Payer: Self-pay

## 2014-04-01 DIAGNOSIS — Z1231 Encounter for screening mammogram for malignant neoplasm of breast: Secondary | ICD-10-CM

## 2014-04-11 ENCOUNTER — Ambulatory Visit
Admission: RE | Admit: 2014-04-11 | Discharge: 2014-04-11 | Disposition: A | Discharge status: Home / Self Care | Payer: BLUE CROSS/BLUE SHIELD | Source: Ambulatory Visit

## 2014-04-11 DIAGNOSIS — Z1231 Encounter for screening mammogram for malignant neoplasm of breast: Secondary | ICD-10-CM

## 2014-06-05 ENCOUNTER — Emergency Department (HOSPITAL_BASED_OUTPATIENT_CLINIC_OR_DEPARTMENT_OTHER)
Admission: EM | Admit: 2014-06-05 | Discharge: 2014-06-05 | Discharge status: Against Medical Advice | Payer: BLUE CROSS/BLUE SHIELD | Attending: Emergency Medicine | Admitting: Emergency Medicine

## 2014-06-05 ENCOUNTER — Encounter (HOSPITAL_BASED_OUTPATIENT_CLINIC_OR_DEPARTMENT_OTHER): Payer: Self-pay | Admitting: Emergency Medicine

## 2014-06-05 DIAGNOSIS — Z8669 Personal history of other diseases of the nervous system and sense organs: Secondary | ICD-10-CM | POA: Diagnosis not present

## 2014-06-05 DIAGNOSIS — Z8719 Personal history of other diseases of the digestive system: Secondary | ICD-10-CM | POA: Diagnosis not present

## 2014-06-05 DIAGNOSIS — M545 Low back pain, unspecified: Secondary | ICD-10-CM

## 2014-06-05 DIAGNOSIS — E039 Hypothyroidism, unspecified: Secondary | ICD-10-CM | POA: Diagnosis not present

## 2014-06-05 DIAGNOSIS — I10 Essential (primary) hypertension: Secondary | ICD-10-CM | POA: Insufficient documentation

## 2014-06-05 DIAGNOSIS — M549 Dorsalgia, unspecified: Secondary | ICD-10-CM | POA: Diagnosis present

## 2014-06-05 DIAGNOSIS — Z79899 Other long term (current) drug therapy: Secondary | ICD-10-CM | POA: Diagnosis not present

## 2014-06-05 DIAGNOSIS — Z862 Personal history of diseases of the blood and blood-forming organs and certain disorders involving the immune mechanism: Secondary | ICD-10-CM | POA: Insufficient documentation

## 2014-06-05 NOTE — ED Provider Notes (Signed)
CSN: [id number]     Arrival date & time 06/05/14  0619 History   First MD Initiated Contact with Patient 06/05/14 0701     Chief Complaint  Patient presents with  . Back Pain     (Consider location/radiation/quality/duration/timing/severity/associated sxs/prior Treatment) HPI Comments: Patient presents with back pain. She states that she was standing in the shower this morning and had a sudden onset of pain in her right mid back. It's nonradiating. She does see it's worse with movements but is also worse with deep breathing. She feels a little bit short of breath which feels like it's more from the pain. She denies any radiation of the pain. She denies any numbness or weakness to her legs. She denies any urinary symptoms. She denies abdominal pain. She denies any leg pain or swelling. She denies any estrogen use. She denies any history of DVTs. She denies any recent immobilization. She describes as a sharp pain that is constant but she took some pain reliever prior to coming in and it feels better currently.  Patient is a 54 y.o. female presenting with back pain.  Back Pain Associated symptoms: no abdominal pain, no chest pain, no fever, no headaches, no numbness and no weakness     Past Medical History  Diagnosis Date  . Anemia   . Hypertension   . Insomnia   . ANEMIA-NOS 11/27/2006  . BACK PAIN, LUMBAR 09/22/2009  . BENIGN NEOPLASM [place] SITE DIGESTIVE SYSTEM 03/19/2006  . GOITER, MULTINODULAR 08/03/2009  . HEMORRHOIDS 06/20/2007  . HIATAL HERNIA 03/19/2006  . HYPERTENSION 11/27/2006  . Hypothyroidism 05/07/2010  . 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 PLANTAR FASCIITIS, RIGHT 07/13/2009  . SLEEP APNEA 06/20/2007  . Insomnia    Past Surgical History  Procedure Laterality Date  . Panendoscopy    . Ankle surgery      1995   Family History  Problem Relation Age of Onset  . Goiter Mother     Resuction of a benign goiter  . Hyperlipidemia Mother   . Hypertension Mother   .  Cancer Father    History  Substance Use Topics  . Smoking status: Never Smoker   . Smokeless tobacco: Never Used  . Alcohol Use: No   OB History    No data available     Review of Systems  Constitutional: Negative for fever, chills, diaphoresis and fatigue.  HENT: Negative for congestion, rhinorrhea and sneezing.   Eyes: Negative.   Respiratory: Negative for cough, chest tightness and shortness of breath.   Cardiovascular: Negative for chest pain and leg swelling.  Gastrointestinal: Negative for nausea, vomiting, abdominal pain, diarrhea and blood in stool.  Genitourinary: Negative for frequency, hematuria, flank pain and difficulty urinating.  Musculoskeletal: Positive for back pain. Negative for arthralgias.  Skin: Negative for rash.  Neurological: Negative for dizziness, speech difficulty, weakness, numbness and headaches.      Allergies  Diclofenac sodium and Azor  Home Medications   Prior to Admission medications   Medication Sig Start Date End Date Taking? Authorizing Provider  amLODipine (NORVASC) 10 MG tablet TAKE 1 TABLET BY MOUTH EVERY DAY 04/02/12   Ricard Dillon, MD  levothyroxine (SYNTHROID, LEVOTHROID) 125 MCG tablet Take 125 mcg by mouth daily. 01/25/11   Ricard Dillon, MD   BP 165/88 mmHg  Pulse 86  Temp(Src) 97.7 F (36.5 C) (Oral)  Resp 18  SpO2 94% Physical Exam  Constitutional: She is oriented to person, place, and time. She appears well-developed and  well-nourished.  HENT:  Head: Normocephalic and atraumatic.  Eyes: Pupils are equal, round, and reactive to light.  Neck: Normal range of motion. Neck supple.  Cardiovascular: Normal rate, regular rhythm and normal heart sounds.   Pulmonary/Chest: Effort normal and breath sounds normal. No respiratory distress. She has no wheezes. She has no rales. She exhibits no tenderness.  Abdominal: Soft. Bowel sounds are normal. There is no tenderness. There is no rebound and no guarding.  Musculoskeletal:  Normal range of motion. She exhibits no edema.  Patient's pain is in the upper lumbar/lower thoracic region on the right. There is no spinal tenderness. There is no reproducible tenderness on palpation.  Lymphadenopathy:    She has no cervical adenopathy.  Neurological: She is alert and oriented to person, place, and time.  Skin: Skin is warm and dry. No rash noted.  Psychiatric: She has a normal mood and affect.    ED Course  Procedures (including critical care time) Labs Review Labs Reviewed - No data to display  Imaging Review No results found.   EKG Interpretation None      MDM   Final diagnoses:  Right-sided low back pain without sciatica    Patient presents with a sudden onset of pain in her right mid back. She had some pleuritic component and some shortness of breath associated with it. It does sound musculoskeletal and that it's worse with movement but it was not reproducible on palpation. She had no radicular symptoms. I was given go ahead and check a urine and a d-dimer however while we are waiting for these tests to be collected, patient evidently left the ED without my knowledge.    Malvin Johns, MD 06/05/14 [phone]

## 2014-06-05 NOTE — ED Notes (Signed)
Pt reports awoke this morning feeling fine but while in shower had onset of severe back pain and could not identify a cause or event

## 2014-06-05 NOTE — ED Notes (Signed)
Patient and visitor did not inform ED staff that they were leaving after MD exam and orders placed. Appeared frustrated to Network engineer as they left treatment area.

## 2014-09-17 ENCOUNTER — Emergency Department (HOSPITAL_BASED_OUTPATIENT_CLINIC_OR_DEPARTMENT_OTHER)
Admission: EM | Admit: 2014-09-17 | Discharge: 2014-09-17 | Disposition: A | Discharge status: Home / Self Care | Payer: BLUE CROSS/BLUE SHIELD | Attending: Emergency Medicine | Admitting: Emergency Medicine

## 2014-09-17 ENCOUNTER — Encounter (HOSPITAL_BASED_OUTPATIENT_CLINIC_OR_DEPARTMENT_OTHER): Payer: Self-pay | Admitting: *Deleted

## 2014-09-17 DIAGNOSIS — I10 Essential (primary) hypertension: Secondary | ICD-10-CM | POA: Insufficient documentation

## 2014-09-17 DIAGNOSIS — Z8739 Personal history of other diseases of the musculoskeletal system and connective tissue: Secondary | ICD-10-CM | POA: Insufficient documentation

## 2014-09-17 DIAGNOSIS — E663 Overweight: Secondary | ICD-10-CM | POA: Insufficient documentation

## 2014-09-17 DIAGNOSIS — X58XXXA Exposure to other specified factors, initial encounter: Secondary | ICD-10-CM | POA: Insufficient documentation

## 2014-09-17 DIAGNOSIS — Y998 Other external cause status: Secondary | ICD-10-CM | POA: Insufficient documentation

## 2014-09-17 DIAGNOSIS — Z86018 Personal history of other benign neoplasm: Secondary | ICD-10-CM | POA: Insufficient documentation

## 2014-09-17 DIAGNOSIS — Y9389 Activity, other specified: Secondary | ICD-10-CM | POA: Insufficient documentation

## 2014-09-17 DIAGNOSIS — Z8719 Personal history of other diseases of the digestive system: Secondary | ICD-10-CM | POA: Insufficient documentation

## 2014-09-17 DIAGNOSIS — Y9289 Other specified places as the place of occurrence of the external cause: Secondary | ICD-10-CM | POA: Insufficient documentation

## 2014-09-17 DIAGNOSIS — E039 Hypothyroidism, unspecified: Secondary | ICD-10-CM | POA: Insufficient documentation

## 2014-09-17 DIAGNOSIS — T783XXA Angioneurotic edema, initial encounter: Secondary | ICD-10-CM

## 2014-09-17 DIAGNOSIS — Z8669 Personal history of other diseases of the nervous system and sense organs: Secondary | ICD-10-CM | POA: Insufficient documentation

## 2014-09-17 LAB — CBC WITH DIFFERENTIAL/PLATELET
BASOS ABS: 0 10*3/uL (ref 0.0–0.1)
Basophils Relative: 0 % (ref 0–1)
EOS PCT: 2 % (ref 0–5)
Eosinophils Absolute: 0.2 10*3/uL (ref 0.0–0.7)
HCT: 36.3 % (ref 36.0–46.0)
Hemoglobin: 11.9 g/dL — ABNORMAL LOW (ref 12.0–15.0)
LYMPHS ABS: 3.3 10*3/uL (ref 0.7–4.0)
LYMPHS PCT: 35 % (ref 12–46)
MCH: 29.2 pg (ref 26.0–34.0)
MCHC: 32.8 g/dL (ref 30.0–36.0)
MCV: 89 fL (ref 78.0–100.0)
MONO ABS: 0.6 10*3/uL (ref 0.1–1.0)
MONOS PCT: 7 % (ref 3–12)
NEUTROS ABS: 5.4 10*3/uL (ref 1.7–7.7)
Neutrophils Relative %: 56 % (ref 43–77)
Platelets: 310 10*3/uL (ref 150–400)
RBC: 4.08 MIL/uL (ref 3.87–5.11)
RDW: 15.8 % — ABNORMAL HIGH (ref 11.5–15.5)
WBC: 9.6 10*3/uL (ref 4.0–10.5)

## 2014-09-17 LAB — BASIC METABOLIC PANEL
ANION GAP: 11 (ref 5–15)
BUN: 20 mg/dL (ref 6–20)
CO2: 27 mmol/L (ref 22–32)
Calcium: 8.7 mg/dL — ABNORMAL LOW (ref 8.9–10.3)
Chloride: 104 mmol/L (ref 101–111)
Creatinine, Ser: 0.68 mg/dL (ref 0.44–1.00)
GFR calc non Af Amer: >60 mL/min (ref >60)
GLUCOSE: 134 mg/dL — AB (ref 65–99)
Potassium: 3.1 mmol/L — ABNORMAL LOW (ref 3.5–5.1)
Sodium: 142 mmol/L (ref 135–145)

## 2014-09-17 MED ORDER — FAMOTIDINE 20 MG PO TABS: 20.0000 mg | ORAL_TABLET | Freq: Two times a day (BID) | ORAL | Status: DC

## 2014-09-17 MED ORDER — FAMOTIDINE IN NACL 20-0.9 MG/50ML-% IV SOLN
20.0000 mg | Freq: Once | INTRAVENOUS | Status: AC
  Administered 2014-09-17: 20 mg via INTRAVENOUS
  Filled 2014-09-17: qty 50

## 2014-09-17 MED ORDER — DIPHENHYDRAMINE HCL 50 MG/ML IJ SOLN
25.0000 mg | Freq: Once | INTRAMUSCULAR | Status: AC
  Administered 2014-09-17: 25 mg via INTRAVENOUS
  Filled 2014-09-17: qty 1

## 2014-09-17 MED ORDER — EPINEPHRINE HCL 1 MG/ML IJ SOLN
0.3000 mg | Freq: Once | INTRAMUSCULAR | Status: AC
  Administered 2014-09-17: 0.3 mg via INTRAMUSCULAR
  Filled 2014-09-17: qty 1

## 2014-09-17 MED ORDER — PREDNISONE 50 MG PO TABS: 50.0000 mg | ORAL_TABLET | Freq: Every day | ORAL | Status: DC

## 2014-09-17 MED ORDER — METHYLPREDNISOLONE SODIUM SUCC 125 MG IJ SOLR
125.0000 mg | Freq: Once | INTRAMUSCULAR | Status: AC
  Administered 2014-09-17: 125 mg via INTRAVENOUS
  Filled 2014-09-17: qty 2

## 2014-09-17 MED ORDER — DIPHENHYDRAMINE HCL 25 MG PO TABS: 25.0000 mg | ORAL_TABLET | Freq: Four times a day (QID) | ORAL | Status: DC | PRN

## 2014-09-17 NOTE — Discharge Instructions (Signed)
Angioedema °Angioedema is sudden puffiness (swelling), often of the skin. It can happen: °· On your face or privates (genitals). °· In your belly (abdomen) or other body parts. °It usually happens quickly and gets better in 1 or 2 days. It often starts at night and is found when you wake up. You may get red, itchy patches of skin (hives). Attacks can be dangerous if your breathing passages get puffy. °The condition may happen only once, or it can come back at random times. It may happen for several years before it goes away for good. °HOME CARE °· Only take medicines as told by your doctor. °· Always carry your emergency allergy medicines with you. °· Wear a medical bracelet as told by your doctor. °· Avoid things that you know will cause attacks (triggers). °GET HELP IF: °· You have another attack. °· Your attacks happen more often or get worse. °· The condition was passed to you by your parents and you want to have children. °GET HELP RIGHT AWAY IF:  °· Your mouth, tongue, or lips are very puffy. °· You have trouble breathing. °· You have trouble swallowing. °· You pass out (faint). °MAKE SURE YOU:  °· Understand these instructions. °· Will watch your condition. °· Will get help right away if you are not doing well or get worse. °Document Released: 02/20/2009 Document Revised: 12/23/2012 Document Reviewed: 10/26/2012 °Exit[employer]® Patient Information ©2015 [employer]. This information is not intended to replace advice given to you by your health care provider. Make sure you discuss any questions you have with your health care provider. ° °

## 2014-09-17 NOTE — ED Provider Notes (Signed)
CSN: [id number]     Arrival date & time 09/17/14  1856 History   First MD Initiated Contact with Patient 09/17/14 [phone]     Chief Complaint  Patient presents with  . Oral Swelling    HPI Patient presents to the emergency room with complaints of tongue swelling. The patient does have a history of angioedema in the past. It was attributed to a previous blood pressure medication she was taking. Her, the patient was changed to Norvasc after that. The patient ate some seafood last night but she's never had trouble with that before. When she woke up with swelling of her tongue. She is not having any difficulty swallowing or breathing. She has some generalized itching. Patient denies any fevers or pain. No vomiting or diarrhea Past Medical History  Diagnosis Date  . Anemia   . Hypertension   . Insomnia   . ANEMIA-NOS 11/27/2006  . BACK PAIN, LUMBAR 09/22/2009  . BENIGN NEOPLASM [place] SITE DIGESTIVE SYSTEM 03/19/2006  . GOITER, MULTINODULAR 08/03/2009  . HEMORRHOIDS 06/20/2007  . HIATAL HERNIA 03/19/2006  . HYPERTENSION 11/27/2006  . Hypothyroidism 05/07/2010  . 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 PLANTAR FASCIITIS, RIGHT 07/13/2009  . SLEEP APNEA 06/20/2007  . Insomnia    Past Surgical History  Procedure Laterality Date  . Panendoscopy    . Ankle surgery      1995   Family History  Problem Relation Age of Onset  . Goiter Mother     Resuction of a benign goiter  . Hyperlipidemia Mother   . Hypertension Mother   . Cancer Father    History  Substance Use Topics  . Smoking status: Never Smoker   . Smokeless tobacco: Never Used  . Alcohol Use: No   OB History    No data available     Review of Systems  All other systems reviewed and are negative.     Allergies  Diclofenac sodium and Azor  Home Medications   Prior to Admission medications   Medication Sig Start Date End Date Taking? Authorizing Provider  amLODipine (NORVASC) 10 MG tablet TAKE 1 TABLET BY MOUTH EVERY  DAY 04/02/12   Ricard Dillon, MD  diphenhydrAMINE (BENADRYL) 25 MG tablet Take 1 tablet (25 mg total) by mouth every 6 (six) hours as needed. 09/17/14   Dorie Rank, MD  famotidine (PEPCID) 20 MG tablet Take 1 tablet (20 mg total) by mouth 2 (two) times daily. 09/17/14   Dorie Rank, MD  levothyroxine (SYNTHROID, LEVOTHROID) 125 MCG tablet Take 125 mcg by mouth daily. 01/25/11   Ricard Dillon, MD  predniSONE (DELTASONE) 50 MG tablet Take 1 tablet (50 mg total) by mouth daily. 09/17/14   Dorie Rank, MD   BP 144/63 mmHg  Pulse 101  Temp(Src) 98.2 F (36.8 C) (Oral)  Resp 18  SpO2 94% Physical Exam  Constitutional: She appears well-developed and well-nourished. No distress.  overweight  HENT:  Head: Normocephalic and atraumatic.  Right Ear: External ear normal.  Left Ear: External ear normal.  Mouth/Throat: No uvula swelling.  Tongue swelling noted, patient is able to handle her secretions  Eyes: Conjunctivae are normal. Right eye exhibits no discharge. Left eye exhibits no discharge. No scleral icterus.  Neck: Neck supple. No tracheal deviation present.  Cardiovascular: Normal rate, regular rhythm and intact distal pulses.   Pulmonary/Chest: Effort normal and breath sounds normal. No stridor. No respiratory distress. She has no wheezes. She has no rales.  Abdominal: Soft. Bowel sounds are  normal. She exhibits no distension. There is no tenderness. There is no rebound and no guarding.  Musculoskeletal: She exhibits no edema or tenderness.  Neurological: She is alert. She has normal strength. No cranial nerve deficit (no facial droop, extraocular movements intact, no slurred speech) or sensory deficit. She exhibits normal muscle tone. She displays no seizure activity. Coordination normal.  Skin: Skin is warm and dry. No rash noted.  Psychiatric: She has a normal mood and affect.  Nursing note and vitals reviewed.   ED Course  Procedures (including critical care time) Labs Review Labs Reviewed   CBC WITH DIFFERENTIAL/PLATELET - Abnormal; Notable for the following:    Hemoglobin 11.9 (*)    RDW 15.8 (*)    All other components within normal limits  BASIC METABOLIC PANEL - Abnormal; Notable for the following:    Potassium 3.1 (*)    Glucose, Bld 134 (*)    Calcium 8.7 (*)    All other components within normal limits    Medications  methylPREDNISolone sodium succinate (SOLU-MEDROL) 125 mg/2 mL injection 125 mg (125 mg Intravenous Given 09/17/14 0900)  diphenhydrAMINE (BENADRYL) injection 25 mg (25 mg Intravenous Given 09/17/14 0859)  famotidine (PEPCID) IVPB 20 mg premix (0 mg Intravenous Stopped 09/17/14 1049)  EPINEPHrine (ADRENALIN) injection 0.3 mg (0.3 mg Intramuscular Given 09/17/14 1245)     MDM   Final diagnoses:  Angioedema, initial encounter    Pt improved with treatment.  Tongues swelling is improving.  ?etiology.  She is not on an ace inhibitor.  Recommend outpatient follow up with allergist, possible c1 esterase deficiency testing.    Dorie Rank, MD 09/17/14 [phone]

## 2014-09-17 NOTE — ED Notes (Signed)
MD at bedside. 

## 2014-09-17 NOTE — ED Notes (Signed)
Pt has completed all meds ordered by EDP, pt states still feels tongue being swollen, awaiting EDP reevaluation. Tracheal sounds/ breathsounds remain clear.

## 2014-09-17 NOTE — ED Notes (Signed)
Presents to ED "my tongue is swolllen"

## 2014-09-17 NOTE — ED Notes (Signed)
Pt states tongue is less swollen at this time, feels better, would like to go home

## 2014-09-17 NOTE — ED Notes (Signed)
Placed in high fowlers position, placed on cont POX monitoring

## 2014-09-17 NOTE — ED Notes (Signed)
Rx reviewed with pt and family member, utilized teach back method

## 2015-03-09 ENCOUNTER — Emergency Department (HOSPITAL_BASED_OUTPATIENT_CLINIC_OR_DEPARTMENT_OTHER)
Admission: EM | Admit: 2015-03-09 | Discharge: 2015-03-09 | Disposition: A | Discharge status: Home / Self Care | Payer: BLUE CROSS/BLUE SHIELD | Attending: Emergency Medicine | Admitting: Emergency Medicine

## 2015-03-09 ENCOUNTER — Encounter (HOSPITAL_BASED_OUTPATIENT_CLINIC_OR_DEPARTMENT_OTHER): Payer: Self-pay | Admitting: *Deleted

## 2015-03-09 DIAGNOSIS — Z86018 Personal history of other benign neoplasm: Secondary | ICD-10-CM | POA: Insufficient documentation

## 2015-03-09 DIAGNOSIS — Z7952 Long term (current) use of systemic steroids: Secondary | ICD-10-CM | POA: Insufficient documentation

## 2015-03-09 DIAGNOSIS — E890 Postprocedural hypothyroidism: Secondary | ICD-10-CM | POA: Diagnosis not present

## 2015-03-09 DIAGNOSIS — L509 Urticaria, unspecified: Secondary | ICD-10-CM | POA: Insufficient documentation

## 2015-03-09 DIAGNOSIS — Z8719 Personal history of other diseases of the digestive system: Secondary | ICD-10-CM | POA: Insufficient documentation

## 2015-03-09 DIAGNOSIS — Z862 Personal history of diseases of the blood and blood-forming organs and certain disorders involving the immune mechanism: Secondary | ICD-10-CM | POA: Diagnosis not present

## 2015-03-09 DIAGNOSIS — Z79899 Other long term (current) drug therapy: Secondary | ICD-10-CM | POA: Diagnosis not present

## 2015-03-09 DIAGNOSIS — G4700 Insomnia, unspecified: Secondary | ICD-10-CM | POA: Diagnosis not present

## 2015-03-09 DIAGNOSIS — Z8739 Personal history of other diseases of the musculoskeletal system and connective tissue: Secondary | ICD-10-CM | POA: Insufficient documentation

## 2015-03-09 DIAGNOSIS — R51 Headache: Secondary | ICD-10-CM | POA: Diagnosis not present

## 2015-03-09 DIAGNOSIS — E042 Nontoxic multinodular goiter: Secondary | ICD-10-CM | POA: Diagnosis not present

## 2015-03-09 DIAGNOSIS — I10 Essential (primary) hypertension: Secondary | ICD-10-CM

## 2015-03-09 MED ORDER — PREDNISONE 20 MG PO TABS: 20.0000 mg | ORAL_TABLET | Freq: Two times a day (BID) | ORAL | Status: DC

## 2015-03-09 MED ORDER — CLONIDINE HCL 0.1 MG PO TABS: 0.2000 mg | ORAL_TABLET | Freq: Two times a day (BID) | ORAL | Status: DC

## 2015-03-09 MED ORDER — CLONIDINE HCL 0.1 MG PO TABS
0.1000 mg | ORAL_TABLET | Freq: Once | ORAL | Status: AC
Start: 2015-03-09 — End: 2015-03-09
  Administered 2015-03-09: 0.1 mg via ORAL
  Filled 2015-03-09: qty 1

## 2015-03-09 NOTE — ED Provider Notes (Signed)
CSN: [id number]     Arrival date & time 03/09/15  1705 History  By signing my name below, I, Jolayne Panther, attest that this documentation has been prepared under the direction and in the presence of Tanna Furry, MD. Electronically Signed: Jolayne Panther, Scribe. 03/09/2015. 7:30 PM.     Chief Complaint  Patient presents with  . Hypertension    The history is provided by the patient. No language interpreter was used.    HPI Comments: Meredith Johnson is a 54 y.o. female who presents to the Emergency Department complaining of onset of lumps and itching hives bilaterally on her upper extremities around 8 AM this morning. She also reports a mild associated HA. Pt notes that she used her epi pen with little relief of her symptoms. Pt also reports that her physician has recently been switching her blood pressure medication because they have been causing tongue swelling. Pt has been prescribed Azod, Kenalog, and Lisinopril all which resulted in tongue/jaw swelling. Pt is currently supposed to be takingTekturna but has not taken it recently because the pharmacy was out of it. She is also currently taking prednisone eye drops and Zyrtec for treatment of her allergies. She reports that she last took licinopril three days ago and she notes lip/jaw swelling after taking it. Her blood pressure is currently 174/100. She denies lightheadedness and tongue swelling today .   PCP: Benito Mccreedy, MD.  Past Medical History  Diagnosis Date  . Anemia   . Hypertension   . Insomnia   . ANEMIA-NOS 11/27/2006  . BACK PAIN, LUMBAR 09/22/2009  . BENIGN NEOPLASM [place] SITE DIGESTIVE SYSTEM 03/19/2006  . GOITER, MULTINODULAR 08/03/2009  . HEMORRHOIDS 06/20/2007  . HIATAL HERNIA 03/19/2006  . HYPERTENSION 11/27/2006  . Hypothyroidism 05/07/2010  . 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 PLANTAR FASCIITIS, RIGHT 07/13/2009  . SLEEP APNEA 06/20/2007  . Insomnia    Past Surgical History  Procedure  Laterality Date  . Panendoscopy    . Ankle surgery      1995   Family History  Problem Relation Age of Onset  . Goiter Mother     Resuction of a benign goiter  . Hyperlipidemia Mother   . Hypertension Mother   . Cancer Father    Social History  Substance Use Topics  . Smoking status: Never Smoker   . Smokeless tobacco: Never Used  . Alcohol Use: No   OB History    No data available     Review of Systems  Constitutional: Negative for fever, chills, diaphoresis, appetite change and fatigue.  HENT: Negative for mouth sores, sore throat and trouble swallowing.   Eyes: Negative for visual disturbance.  Respiratory: Negative for cough, chest tightness, shortness of breath and wheezing.   Cardiovascular: Negative for chest pain.  Gastrointestinal: Negative for nausea, vomiting, abdominal pain, diarrhea and abdominal distention.  Endocrine: Negative for polydipsia, polyphagia and polyuria.  Genitourinary: Negative for dysuria, frequency and hematuria.  Musculoskeletal: Negative for gait problem.  Skin: Positive for rash. Negative for color change and pallor.  Neurological: Positive for headaches. Negative for dizziness, syncope and light-headedness.  Hematological: Does not bruise/bleed easily.  Psychiatric/Behavioral: Negative for behavioral problems and confusion.   Allergies  Diclofenac sodium and Azor  Home Medications   Prior to Admission medications   Medication Sig Start Date End Date Taking? Authorizing Provider  amLODipine (NORVASC) 10 MG tablet TAKE 1 TABLET BY MOUTH EVERY DAY 04/02/12   Ricard Dillon, MD  cloNIDine (  CATAPRES) 0.1 MG tablet Take 2 tablets (0.2 mg total) by mouth 2 (two) times daily. 03/10/15   Alfonzo Beers, MD  diphenhydrAMINE (BENADRYL) 25 MG tablet Take 1 tablet (25 mg total) by mouth every 6 (six) hours as needed. 09/17/14   Dorie Rank, MD  famotidine (PEPCID) 20 MG tablet Take 1 tablet (20 mg total) by mouth 2 (two) times daily. 09/17/14   Dorie Rank,  MD  levothyroxine (SYNTHROID, LEVOTHROID) 125 MCG tablet Take 125 mcg by mouth daily. 01/25/11   Ricard Dillon, MD  predniSONE (DELTASONE) 20 MG tablet Take 1 tablet (20 mg total) by mouth 2 (two) times daily with a meal. 03/09/15   Tanna Furry, MD   BP 174/89 mmHg  Pulse 93  Temp(Src) 98.4 F (36.9 C) (Oral)  Resp 18  Ht 5\' 5"  (1.651 m)  Wt 240 lb (108.863 kg)  BMI 39.94 kg/m2  SpO2 96% Physical Exam  Constitutional: She is oriented to person, place, and time. She appears well-developed and well-nourished. No distress.  HENT:  Head: Normocephalic.  Uvula midline and no edema   Eyes: Conjunctivae are normal. Pupils are equal, round, and reactive to light. No scleral icterus.  Neck: Normal range of motion. Neck supple. No thyromegaly present.  Cardiovascular: Normal rate and regular rhythm.  Exam reveals no gallop and no friction rub.   No murmur heard. Pulmonary/Chest: Effort normal and breath sounds normal. No respiratory distress. She has no wheezes. She has no rales.  Abdominal: Soft. Bowel sounds are normal. She exhibits no distension. There is no tenderness. There is no rebound.  Musculoskeletal: Normal range of motion.  Large carry on left and right antecubital fossa   Neurological: She is alert and oriented to person, place, and time.  Skin: Skin is warm and dry. No rash noted.  Psychiatric: She has a normal mood and affect. Her behavior is normal.    ED Course  Procedures  DIAGNOSTIC STUDIES:    Oxygen Saturation is 96% on RA, adequate by my interpretation.   COORDINATION OF CARE:  6:38 PM Will administer pt medication in the ED. Will prescribe pt prednisone and clonidine. Discussed treatment plan with pt at bedside and pt agreed to plan.    MDM   Final diagnoses:  Essential hypertension  Urticaria    I personally performed the services described in this documentation, which was scribed in my presence. The recorded information has been reviewed and is  accurate.    Tanna Furry, MD 03/14/15 [phone]

## 2015-03-09 NOTE — Discharge Instructions (Signed)
Benadryl 3 times per day until you've done 24 hours without hives or swelling. If your hives persist tomorrow, fill and take your prescription for prednisone. Clonidine for your blood pressure. Follow up with your primary care physician. Check and record your blood pressure twice daily for your next appointment.  Hypertension Hypertension, commonly called high blood pressure, is when the force of blood pumping through your arteries is too strong. Your arteries are the blood vessels that carry blood from your heart throughout your body. A blood pressure reading consists of a higher number over a lower number, such as 110/72. The higher number (systolic) is the pressure inside your arteries when your heart pumps. The lower number (diastolic) is the pressure inside your arteries when your heart relaxes. Ideally you want your blood pressure below 120/80. Hypertension forces your heart to work harder to pump blood. Your arteries may become narrow or stiff. Having untreated or uncontrolled hypertension can cause heart attack, stroke, kidney disease, and other problems. RISK FACTORS Some risk factors for high blood pressure are controllable. Others are not.  Risk factors you cannot control include:   Race. You may be at higher risk if you are African American.  Age. Risk increases with age.  Gender. Men are at higher risk than women before age 28 years. After age 67, women are at higher risk than men. Risk factors you can control include:  Not getting enough exercise or physical activity.  Being overweight.  Getting too much fat, sugar, calories, or salt in your diet.  Drinking too much alcohol. SIGNS AND SYMPTOMS Hypertension does not usually cause signs or symptoms. Extremely high blood pressure (hypertensive crisis) may cause headache, anxiety, shortness of breath, and nosebleed. DIAGNOSIS To check if you have hypertension, your health care provider will measure your blood pressure while  you are seated, with your arm held at the level of your heart. It should be measured at least twice using the same arm. Certain conditions can cause a difference in blood pressure between your right and left arms. A blood pressure reading that is higher than normal on one occasion does not mean that you need treatment. If it is not clear whether you have high blood pressure, you may be asked to return on a different day to have your blood pressure checked again. Or, you may be asked to monitor your blood pressure at home for 1 or more weeks. TREATMENT Treating high blood pressure includes making lifestyle changes and possibly taking medicine. Living a healthy lifestyle can help lower high blood pressure. You may need to change some of your habits. Lifestyle changes may include:  Following the DASH diet. This diet is high in fruits, vegetables, and whole grains. It is low in salt, red meat, and added sugars.  Keep your sodium intake below 2,300 mg per day.  Getting at least 30-45 minutes of aerobic exercise at least 4 times per week.  Losing weight if necessary.  Not smoking.  Limiting alcoholic beverages.  Learning ways to reduce stress. Your health care provider may prescribe medicine if lifestyle changes are not enough to get your blood pressure under control, and if one of the following is true:  You are 68-66 years of age and your systolic blood pressure is above 140.  You are 37 years of age or older, and your systolic blood pressure is above 150.  Your diastolic blood pressure is above 90.  You have diabetes, and your systolic blood pressure is over 140 or  your diastolic blood pressure is over 90.  You have kidney disease and your blood pressure is above 140/90.  You have heart disease and your blood pressure is above 140/90. Your personal target blood pressure may vary depending on your medical conditions, your age, and other factors. HOME CARE INSTRUCTIONS  Have your blood  pressure rechecked as directed by your health care provider.   Take medicines only as directed by your health care provider. Follow the directions carefully. Blood pressure medicines must be taken as prescribed. The medicine does not work as well when you skip doses. Skipping doses also puts you at risk for problems.  Do not smoke.   Monitor your blood pressure at home as directed by your health care provider. SEEK MEDICAL CARE IF:   You think you are having a reaction to medicines taken.  You have recurrent headaches or feel dizzy.  You have swelling in your ankles.  You have trouble with your vision. SEEK IMMEDIATE MEDICAL CARE IF:  You develop a severe headache or confusion.  You have unusual weakness, numbness, or feel faint.  You have severe chest or abdominal pain.  You vomit repeatedly.  You have trouble breathing. MAKE SURE YOU:   Understand these instructions.  Will watch your condition.  Will get help right away if you are not doing well or get worse.   This information is not intended to replace advice given to you by your health care provider. Make sure you discuss any questions you have with your health care provider.   Document Released: 03/04/2005 Document Revised: 07/19/2014 Document Reviewed: 12/25/2012 Elsevier Interactive Patient Education 2016 [place] are itchy, red, swollen areas of the skin. They can vary in size and location on your body. Hives can come and go for hours or several days (acute hives) or for several weeks (chronic hives). Hives do not spread from person to person (noncontagious). They may get worse with scratching, exercise, and emotional stress. CAUSES   Allergic reaction to food, additives, or drugs.  Infections, including the common cold.  Illness, such as vasculitis, lupus, or thyroid disease.  Exposure to sunlight, heat, or cold.  Exercise.  Stress.  Contact with chemicals. SYMPTOMS   Red or  white swollen patches on the skin. The patches may change size, shape, and location quickly and repeatedly.  Itching.  Swelling of the hands, feet, and face. This may occur if hives develop deeper in the skin. DIAGNOSIS  Your caregiver can usually tell what is wrong by performing a physical exam. Skin or blood tests may also be done to determine the cause of your hives. In some cases, the cause cannot be determined. TREATMENT  Mild cases usually get better with medicines such as antihistamines. Severe cases may require an emergency epinephrine injection. If the cause of your hives is known, treatment includes avoiding that trigger.  HOME CARE INSTRUCTIONS   Avoid causes that trigger your hives.  Take antihistamines as directed by your caregiver to reduce the severity of your hives. Non-sedating or low-sedating antihistamines are usually recommended. Do not drive while taking an antihistamine.  Take any other medicines prescribed for itching as directed by your caregiver.  Wear loose-fitting clothing.  Keep all follow-up appointments as directed by your caregiver. SEEK MEDICAL CARE IF:   You have persistent or severe itching that is not relieved with medicine.  You have painful or swollen joints. SEEK IMMEDIATE MEDICAL CARE IF:   You have a fever.  Your  tongue or lips are swollen.  You have trouble breathing or swallowing.  You feel tightness in the throat or chest.  You have abdominal pain. These problems may be the first sign of a life-threatening allergic reaction. Call your local emergency services (911 in [place] MAKE SURE YOU:   Understand these instructions.  Will watch your condition.  Will get help right away if you are not doing well or get worse.   This information is not intended to replace advice given to you by your health care provider. Make sure you discuss any questions you have with your health care provider.   Document Released: 03/04/2005 Document  Revised: 03/09/2013 Document Reviewed: 05/28/2011 Elsevier Interactive Patient Education [employer].

## 2015-03-09 NOTE — ED Notes (Signed)
Pt administered herself an epipen today in her right thigh.  States that she did it because of the hives on her arms, denies respiratory difficulty.

## 2015-03-09 NOTE — ED Notes (Signed)
Pt sent from PCP for eval of HTN.  States that she has not been taking her medication x 2 days.  States that she was taken off her previous meds due to allergic reactions.

## 2015-03-10 ENCOUNTER — Emergency Department (HOSPITAL_BASED_OUTPATIENT_CLINIC_OR_DEPARTMENT_OTHER)
Admission: EM | Admit: 2015-03-10 | Discharge: 2015-03-10 | Disposition: A | Discharge status: Home / Self Care | Payer: BLUE CROSS/BLUE SHIELD | Attending: Emergency Medicine | Admitting: Emergency Medicine

## 2015-03-10 ENCOUNTER — Encounter (HOSPITAL_BASED_OUTPATIENT_CLINIC_OR_DEPARTMENT_OTHER): Payer: Self-pay | Admitting: *Deleted

## 2015-03-10 DIAGNOSIS — Z8719 Personal history of other diseases of the digestive system: Secondary | ICD-10-CM | POA: Insufficient documentation

## 2015-03-10 DIAGNOSIS — E039 Hypothyroidism, unspecified: Secondary | ICD-10-CM | POA: Insufficient documentation

## 2015-03-10 DIAGNOSIS — E042 Nontoxic multinodular goiter: Secondary | ICD-10-CM | POA: Diagnosis not present

## 2015-03-10 DIAGNOSIS — Z8739 Personal history of other diseases of the musculoskeletal system and connective tissue: Secondary | ICD-10-CM | POA: Insufficient documentation

## 2015-03-10 DIAGNOSIS — Z862 Personal history of diseases of the blood and blood-forming organs and certain disorders involving the immune mechanism: Secondary | ICD-10-CM | POA: Insufficient documentation

## 2015-03-10 DIAGNOSIS — Z79899 Other long term (current) drug therapy: Secondary | ICD-10-CM | POA: Diagnosis not present

## 2015-03-10 DIAGNOSIS — Z86018 Personal history of other benign neoplasm: Secondary | ICD-10-CM | POA: Diagnosis not present

## 2015-03-10 DIAGNOSIS — I10 Essential (primary) hypertension: Secondary | ICD-10-CM | POA: Insufficient documentation

## 2015-03-10 DIAGNOSIS — G4700 Insomnia, unspecified: Secondary | ICD-10-CM | POA: Insufficient documentation

## 2015-03-10 LAB — URINALYSIS, ROUTINE W REFLEX MICROSCOPIC
BILIRUBIN URINE: NEGATIVE
Glucose, UA: NEGATIVE mg/dL
HGB URINE DIPSTICK: NEGATIVE
KETONES UR: NEGATIVE mg/dL
Leukocytes, UA: NEGATIVE
NITRITE: NEGATIVE
PH: 6 (ref 5.0–8.0)
Protein, ur: NEGATIVE mg/dL
Specific Gravity, Urine: 1.009 (ref 1.005–1.030)

## 2015-03-10 LAB — BASIC METABOLIC PANEL
Anion gap: 7 (ref 5–15)
BUN: 19 mg/dL (ref 6–20)
CALCIUM: 8.4 mg/dL — AB (ref 8.9–10.3)
CO2: 27 mmol/L (ref 22–32)
CREATININE: 0.75 mg/dL (ref 0.44–1.00)
Chloride: 105 mmol/L (ref 101–111)
GFR calc non Af Amer: >60 mL/min (ref >60)
Glucose, Bld: 133 mg/dL — ABNORMAL HIGH (ref 65–99)
Potassium: 3.4 mmol/L — ABNORMAL LOW (ref 3.5–5.1)
SODIUM: 139 mmol/L (ref 135–145)

## 2015-03-10 MED ORDER — CLONIDINE HCL 0.1 MG PO TABS: 0.2000 mg | ORAL_TABLET | Freq: Two times a day (BID) | ORAL | Status: DC

## 2015-03-10 NOTE — ED Notes (Signed)
Pt c/o increased BP x 2 days seen here last night for same place on clonidine , pt has taking 2 doses and bp continues to be elevated

## 2015-03-10 NOTE — ED Provider Notes (Signed)
CSN: [id number]     Arrival date & time 03/10/15  1627 History   First MD Initiated Contact with Patient 03/10/15 1750     Chief Complaint  Patient presents with  . Hypertension     (Consider location/radiation/quality/duration/timing/severity/associated sxs/prior Treatment) HPI  Pt presents with concerns that her blood pressure remains high.  She has no chest pain, no shortness of breath.  Continues to have some hives on her left arm.  She has taken clonidine today that was prescribed last night and is concerned that pressure remains high. She did not take another prednisone today due to being not sure whether to take it or not.    Past Medical History  Diagnosis Date  . Anemia   . Hypertension   . Insomnia   . ANEMIA-NOS 11/27/2006  . BACK PAIN, LUMBAR 09/22/2009  . BENIGN NEOPLASM [place] SITE DIGESTIVE SYSTEM 03/19/2006  . GOITER, MULTINODULAR 08/03/2009  . HEMORRHOIDS 06/20/2007  . HIATAL HERNIA 03/19/2006  . HYPERTENSION 11/27/2006  . Hypothyroidism 05/07/2010  . 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 PLANTAR FASCIITIS, RIGHT 07/13/2009  . SLEEP APNEA 06/20/2007  . Insomnia    Past Surgical History  Procedure Laterality Date  . Panendoscopy    . Ankle surgery      1995   Family History  Problem Relation Age of Onset  . Goiter Mother     Resuction of a benign goiter  . Hyperlipidemia Mother   . Hypertension Mother   . Cancer Father    Social History  Substance Use Topics  . Smoking status: Never Smoker   . Smokeless tobacco: Never Used  . Alcohol Use: No   OB History    No data available     Review of Systems  ROS reviewed and all otherwise negative except for mentioned in HPI    Allergies  Diclofenac sodium and Azor  Home Medications   Prior to Admission medications   Medication Sig Start Date End Date Taking? Authorizing Provider  amLODipine (NORVASC) 10 MG tablet TAKE 1 TABLET BY MOUTH EVERY DAY 04/02/12   Ricard Dillon, MD  cloNIDine (CATAPRES)  0.1 MG tablet Take 2 tablets (0.2 mg total) by mouth 2 (two) times daily. 03/10/15   Alfonzo Beers, MD  diphenhydrAMINE (BENADRYL) 25 MG tablet Take 1 tablet (25 mg total) by mouth every 6 (six) hours as needed. 09/17/14   Dorie Rank, MD  famotidine (PEPCID) 20 MG tablet Take 1 tablet (20 mg total) by mouth 2 (two) times daily. 09/17/14   Dorie Rank, MD  levothyroxine (SYNTHROID, LEVOTHROID) 125 MCG tablet Take 125 mcg by mouth daily. 01/25/11   Ricard Dillon, MD  predniSONE (DELTASONE) 20 MG tablet Take 1 tablet (20 mg total) by mouth 2 (two) times daily with a meal. 03/09/15   Tanna Furry, MD   BP 170/89 mmHg  Pulse 72  Temp(Src) 98.7 F (37.1 C)  Resp 18  Wt 240 lb (108.863 kg)  SpO2 96%  Vitals reviewed Physical Exam  Physical Examination: General appearance - alert, well appearing, and in no distress Mental status - alert, oriented to person, place, and time Eyes - no conjunctival injection, no scleral icterus Chest - clear to auscultation, no wheezes, rales or rhonchi, symmetric air entry Heart - normal rate, regular rhythm, normal S1, S2, no murmurs, rubs, clicks or gallops Abdomen - soft, nontender, nondistended, no masses or organomegaly Neurological - alert, oriented, normal speech Extremities - peripheral pulses normal, no pedal edema, no clubbing or  cyanosis Skin - normal coloration and turgor, no rashes  ED Course  Procedures (including critical care time) Labs Review Labs Reviewed  BASIC METABOLIC PANEL - Abnormal; Notable for the following:    Potassium 3.4 (*)    Glucose, Bld 133 (*)    Calcium 8.4 (*)    All other components within normal limits  URINALYSIS, ROUTINE W REFLEX MICROSCOPIC (NOT AT [facility]) - Abnormal; Notable for the following:    APPearance CLOUDY (*)    All other components within normal limits    Imaging Review No results found. I have personally reviewed and evaluated these images and lab results as part of my medical decision-making.   EKG  Interpretation   Date/Time:  Friday March 10 2015 18:17:40 EST Ventricular Rate:  83 PR Interval:  192 QRS Duration: 82 QT Interval:  398 QTC Calculation: 467 R Axis:   50 Text Interpretation:  Normal sinus rhythm Normal ECG No significant change  since last tracing Confirmed by Canary Brim  MD, Anzley Dibbern [id number]) on 03/10/2015  6:36:50 PM      MDM   Final diagnoses:  Essential hypertension    Pt presenting with concerns about ongoing hypertension. She has no symptoms, no signs of end organ damage.  D/w patient about hypertension, need for followup with PMD and ongoing BP management.  Discharged with strict return precautions.  Pt agreeable with plan.    Alfonzo Beers, MD 03/11/15 [phone]

## 2015-03-10 NOTE — Discharge Instructions (Signed)
Return to the ED with any concerns including chest pain, difficulty breathing, weakness of arms or legs, fainting, changes in vision or speech, decreased level of alertness/lethargy, or any other alarming symptoms

## 2015-04-14 ENCOUNTER — Other Ambulatory Visit: Payer: Self-pay | Admitting: Cardiology

## 2015-04-14 DIAGNOSIS — D3500 Benign neoplasm of unspecified adrenal gland: Secondary | ICD-10-CM

## 2015-04-17 ENCOUNTER — Other Ambulatory Visit: Payer: Self-pay | Admitting: Obstetrics and Gynecology

## 2015-04-17 DIAGNOSIS — N6311 Unspecified lump in the right breast, upper outer quadrant: Secondary | ICD-10-CM

## 2015-04-19 ENCOUNTER — Ambulatory Visit
Admission: RE | Admit: 2015-04-19 | Discharge: 2015-04-19 | Disposition: A | Discharge status: Home / Self Care | Payer: Managed Care, Other (non HMO) | Source: Ambulatory Visit | Attending: Cardiology | Admitting: Cardiology

## 2015-04-19 ENCOUNTER — Other Ambulatory Visit: Payer: Self-pay | Admitting: Cardiology

## 2015-04-19 DIAGNOSIS — D3500 Benign neoplasm of unspecified adrenal gland: Secondary | ICD-10-CM

## 2015-04-21 ENCOUNTER — Ambulatory Visit
Admission: RE | Admit: 2015-04-21 | Discharge: 2015-04-21 | Disposition: A | Discharge status: Home / Self Care | Payer: No Typology Code available for payment source | Source: Ambulatory Visit | Attending: Obstetrics and Gynecology | Admitting: Obstetrics and Gynecology

## 2015-04-21 ENCOUNTER — Ambulatory Visit
Admission: RE | Admit: 2015-04-21 | Discharge: 2015-04-21 | Disposition: A | Discharge status: Home / Self Care | Payer: Managed Care, Other (non HMO) | Source: Ambulatory Visit | Attending: Obstetrics and Gynecology | Admitting: Obstetrics and Gynecology

## 2015-04-21 DIAGNOSIS — N6311 Unspecified lump in the right breast, upper outer quadrant: Secondary | ICD-10-CM

## 2016-01-15 ENCOUNTER — Ambulatory Visit (INDEPENDENT_AMBULATORY_CARE_PROVIDER_SITE_OTHER): Payer: Self-pay | Admitting: Endocrinology

## 2016-01-15 ENCOUNTER — Encounter: Payer: Self-pay | Admitting: Endocrinology

## 2016-01-15 VITALS — BP 140/84 | HR 99 | Ht 66.0 in | Wt 226.0 lb

## 2016-01-15 DIAGNOSIS — E042 Nontoxic multinodular goiter: Secondary | ICD-10-CM

## 2016-01-15 LAB — TSH: TSH: 2.56 u[IU]/mL (ref 0.35–4.50)

## 2016-01-15 MED ORDER — LEVOTHYROXINE SODIUM 125 MCG PO TABS: 125.0000 ug | ORAL_TABLET | Freq: Every day | ORAL | 2 refills | Status: DC

## 2016-01-15 NOTE — Progress Notes (Signed)
Subjective:    Patient ID: Meredith Johnson, female    DOB: 09/24/1963, 55 y.o.   MRN: [id number]  HPI In 2012, pt had RAI for hyperthyroidism, due to a multinodular goiter. she has no h/o XRT or surgery to the neck.  She does not notice the thyroid.  She has slight weight gain, and assoc fatigue.  She is uncertain if the fatigue was worse on synthroid or nature-thyroid.  Past Medical History:  Diagnosis Date  . Anemia   . ANEMIA-NOS 11/27/2006  . BACK PAIN, LUMBAR 09/22/2009  . BENIGN NEOPLASM [place] SITE DIGESTIVE SYSTEM 03/19/2006  . GOITER, MULTINODULAR 08/03/2009  . HEMORRHOIDS 06/20/2007  . HIATAL HERNIA 03/19/2006  . Hypertension   . HYPERTENSION 11/27/2006  . Hypothyroidism 05/07/2010  . Insomnia   . Insomnia   . 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 PLANTAR FASCIITIS, RIGHT 07/13/2009  . SLEEP APNEA 06/20/2007    Past Surgical History:  Procedure Laterality Date  . ANKLE SURGERY     1995  . PANENDOSCOPY      Social History   Social History  . Marital status: Single    Spouse name: N/A  . Number of children: N/A  . Years of education: N/A   Occupational History  . 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 Smoking status: Never Smoker  . Smokeless tobacco: Never Used  . Alcohol use No  . Drug use: No  . Sexual activity: Yes   Other Topics Concern  . Not on file   Social History Narrative  . No narrative on file    Current Outpatient Prescriptions on File Prior to Visit  Medication Sig Dispense Refill  . diphenhydrAMINE (BENADRYL) 25 MG tablet Take 1 tablet (25 mg total) by mouth every 6 (six) hours as needed. 30 tablet 0  . famotidine (PEPCID) 20 MG tablet Take 1 tablet (20 mg total) by mouth 2 (two) times daily. 10 tablet 0   No current facility-administered medications on file prior to visit.     Allergies  Allergen Reactions  . Diclofenac Sodium   . Azor [Amlodipine-Olmesartan]   . Lisinopril    Hives and swelling   . Norvasc [Amlodipine]     Hives and swelling     Family History  Problem Relation Age of Onset  . Goiter Mother     Resuction of a benign goiter  . Hyperlipidemia Mother   . Hypertension Mother   . Cancer Father     BP 140/84   Pulse 99   Ht 5\' 6"  (1.676 m)   Wt 226 lb (102.5 kg)   SpO2 97%   BMI 36.48 kg/m   Review of Systems Denies weight change, hoarseness, neck pain, visual loss, chest pain, sob, cough, dysphagia, skin rash, depression, headache, numbness, and rhinorrhea.  She has cold intolerance and easy bruising.     Objective:   Physical Exam VS: see vs page GEN: no distress HEAD: head: no deformity eyes: no periorbital swelling, no proptosis external nose and ears are normal mouth: no lesion seen NECK: supple, thyroid is not enlarged CHEST WALL: no deformity LUNGS: clear to auscultation CV: reg rate and rhythm, no murmur ABD: abdomen is soft, nontender.  no hepatosplenomegaly.  not distended.  no hernia MUSCULOSKELETAL: muscle bulk and strength are grossly normal.  no obvious joint swelling.  gait is normal and steady EXTEMITIES: no edema PULSES: no carotid bruit NEURO:  cn 2-12 grossly  intact.   readily moves all 4's.  sensation is intact to touch on all 4's SKIN:  Normal texture and temperature.  No rash or suspicious lesion is visible.   NODES:  None palpable at the neck PSYCH: alert, well-oriented.  Does not appear anxious nor depressed.  I have reviewed outside records, and summarized: Pt was seen here in 2012, and was feeling well. She has been seen in the office and ER several times, all for minor problems  [place] (2012): multiple bilateral solid nodules.  Gland is diffusely heterogeneous.  outside test results are reviewed: TSH=5.2    Assessment & Plan:  Multinodular goiter, due for recheck. Post-RAI hypothyroidism, on rx.  We discussed.  She agrees to change to levothyroxine.    Patient is advised the following: Patient  Instructions  Let's recheck the ultrasound.  you will receive a phone call, about a day and time for an appointment. I have sent a prescription to your pharmacy, for levothyroxine.   Please recheck the blood test in 1 month, here in the office.

## 2016-01-15 NOTE — Patient Instructions (Addendum)
Let's recheck the ultrasound.  you will receive a phone call, about a day and time for an appointment. I have sent a prescription to your pharmacy, for levothyroxine.   Please recheck the blood test in 1 month, here in the office.

## 2016-01-16 ENCOUNTER — Ambulatory Visit
Admission: RE | Admit: 2016-01-16 | Discharge: 2016-01-16 | Disposition: A | Discharge status: Home / Self Care | Payer: Managed Care, Other (non HMO) | Source: Ambulatory Visit | Attending: Endocrinology | Admitting: Endocrinology

## 2016-01-16 DIAGNOSIS — E042 Nontoxic multinodular goiter: Secondary | ICD-10-CM

## 2016-02-15 ENCOUNTER — Ambulatory Visit (INDEPENDENT_AMBULATORY_CARE_PROVIDER_SITE_OTHER): Payer: Managed Care, Other (non HMO) | Admitting: Endocrinology

## 2016-02-15 VITALS — BP 138/86 | HR 103 | Ht 66.0 in | Wt 226.0 lb

## 2016-02-15 DIAGNOSIS — E890 Postprocedural hypothyroidism: Secondary | ICD-10-CM

## 2016-02-15 LAB — TSH: TSH: 1.61 u[IU]/mL (ref 0.35–4.50)

## 2016-02-15 LAB — T4, FREE: Free T4: 0.93 ng/dL (ref 0.60–1.60)

## 2016-02-15 NOTE — Progress Notes (Signed)
Subjective:    Patient ID: Meredith Johnson, female    DOB: 09/24/1963, 55 y.o.   MRN: [id number]  HPI Pt returns for f/u of post-RAI hypothyroidism (in 2012, pt had RAI for hyperthyroidism, due to a multinodular goiter; f/u US in 2017 showed the goiter was slightly smaller).  She does not notice the goiter.  pt states she feels well in general.  Past Medical History:  Diagnosis Date  . Anemia   . ANEMIA-NOS 11/27/2006  . BACK PAIN, LUMBAR 09/22/2009  . BENIGN NEOPLASM [place] SITE DIGESTIVE SYSTEM 03/19/2006  . GOITER, MULTINODULAR 08/03/2009  . HEMORRHOIDS 06/20/2007  . HIATAL HERNIA 03/19/2006  . Hypertension   . HYPERTENSION 11/27/2006  . Hypothyroidism 05/07/2010  . Insomnia   . Insomnia   . 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 PLANTAR FASCIITIS, RIGHT 07/13/2009  . SLEEP APNEA 06/20/2007    Past Surgical History:  Procedure Laterality Date  . ANKLE SURGERY     1995  . PANENDOSCOPY      Social History   Social History  . Marital status: Single    Spouse name: N/A  . Number of children: N/A  . Years of education: N/A   Occupational History  . 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 Smoking status: Never Smoker  . Smokeless tobacco: Never Used  . Alcohol use No  . Drug use: No  . Sexual activity: Yes   Other Topics Concern  . Not on file   Social History Narrative  . No narrative on file    Current Outpatient Prescriptions on File Prior to Visit  Medication Sig Dispense Refill  . Cholecalciferol (VITAMIN D3) 2000 units TABS Take by mouth.    . cloNIDine (CATAPRES - DOSED IN MG/24 HR) 0.2 mg/24hr patch Place 0.2 mg onto the skin once a week.    . Cyanocobalamin (VITAMIN B 12 PO) Take by mouth.    . levothyroxine (SYNTHROID, LEVOTHROID) 125 MCG tablet Take 1 tablet (125 mcg total) by mouth daily before breakfast. 30 tablet 2  . spironolactone (ALDACTONE) 50 MG tablet Take 50 mg by mouth daily.    .  valsartan-hydrochlorothiazide (DIOVAN-HCT) 160-25 MG tablet Take 1 tablet by mouth daily.    . Zinc 50 MG CAPS Take by mouth.    . diphenhydrAMINE (BENADRYL) 25 MG tablet Take 1 tablet (25 mg total) by mouth every 6 (six) hours as needed. (Patient not taking: Reported on 02/15/2016) 30 tablet 0  . famotidine (PEPCID) 20 MG tablet Take 1 tablet (20 mg total) by mouth 2 (two) times daily. (Patient not taking: Reported on 02/15/2016) 10 tablet 0   No current facility-administered medications on file prior to visit.     Allergies  Allergen Reactions  . Diclofenac Sodium   . Azor [Amlodipine-Olmesartan]   . Lisinopril     Hives and swelling   . Norvasc [Amlodipine]     Hives and swelling     Family History  Problem Relation Age of Onset  . Goiter Mother     Resuction of a benign goiter  . Hyperlipidemia Mother   . Hypertension Mother   . Cancer Father     BP 138/86   Pulse (!) 103   Ht 5\' 6"  (1.676 m)   Wt 226 lb (102.5 kg)   SpO2 93%   BMI 36.48 kg/m   Review of Systems Denies neck pain.     Objective:   Physical  Exam VITAL SIGNS:  See vs page.  GENERAL: no distress.  NECK: There is no palpable thyroid enlargement.  No thyroid nodule is palpable.  No palpable lymphadenopathy at the anterior neck.    Lab Results  Component Value Date   TSH 1.61 02/15/2016      Assessment & Plan:  Post-RAI hypothyroidism: well-replaced Please continue the same medication

## 2016-02-15 NOTE — Patient Instructions (Addendum)
blood tests are requested for you today.  We'll let you know about the results.   Please return in 1 year.  

## 2016-02-22 ENCOUNTER — Telehealth: Payer: Self-pay | Admitting: Endocrinology

## 2016-02-22 NOTE — Telephone Encounter (Signed)
I contacted the patient and advised she could come by the office and pick up a copy of her labs.

## 2016-02-22 NOTE — Telephone Encounter (Signed)
Patient would like a copy of her Thyroid lab results recent, and and last thyroid lab so she can compare.

## 2016-05-08 ENCOUNTER — Telehealth: Payer: Self-pay | Admitting: Endocrinology

## 2016-05-08 MED ORDER — LEVOTHYROXINE SODIUM 125 MCG PO TABS: 125.0000 ug | ORAL_TABLET | Freq: Every day | ORAL | 2 refills | Status: DC

## 2016-05-08 NOTE — Telephone Encounter (Signed)
Refill submitted. 

## 2016-05-08 NOTE — Telephone Encounter (Signed)
Levothyroxine called into [facility] on [address]

## 2016-05-09 MED ORDER — LEVOTHYROXINE SODIUM 125 MCG PO TABS: 125.0000 ug | ORAL_TABLET | Freq: Every day | ORAL | 2 refills | Status: DC

## 2016-05-09 NOTE — Telephone Encounter (Signed)
Please resubmit the script to the [facility] on [address].

## 2016-05-09 NOTE — Telephone Encounter (Signed)
Refill resubmitted.  

## 2016-05-10 ENCOUNTER — Other Ambulatory Visit: Payer: Self-pay

## 2016-05-10 MED ORDER — LEVOTHYROXINE SODIUM 125 MCG PO TABS: 125.0000 ug | ORAL_TABLET | Freq: Every day | ORAL | 2 refills | Status: DC

## 2016-06-25 ENCOUNTER — Other Ambulatory Visit: Payer: Self-pay | Admitting: Obstetrics and Gynecology

## 2016-06-25 ENCOUNTER — Telehealth: Payer: Self-pay | Admitting: Nurse Practitioner

## 2016-06-25 DIAGNOSIS — Z1231 Encounter for screening mammogram for malignant neoplasm of breast: Secondary | ICD-10-CM

## 2016-06-25 NOTE — Telephone Encounter (Signed)
Last colonoscopy 10 years ago in this 56 year old female Repeat screening due now

## 2016-06-27 NOTE — Telephone Encounter (Signed)
Notified patient that Dr.Pyrtle states recall colon due now. Patient states she will cb to schedule.

## 2016-07-18 ENCOUNTER — Ambulatory Visit
Admission: RE | Admit: 2016-07-18 | Discharge: 2016-07-18 | Disposition: A | Discharge status: Home / Self Care | Payer: BLUE CROSS/BLUE SHIELD | Source: Ambulatory Visit | Attending: Obstetrics and Gynecology | Admitting: Obstetrics and Gynecology

## 2016-07-18 DIAGNOSIS — Z1231 Encounter for screening mammogram for malignant neoplasm of breast: Secondary | ICD-10-CM

## 2016-08-15 ENCOUNTER — Telehealth: Payer: Self-pay | Admitting: Endocrinology

## 2016-08-15 ENCOUNTER — Other Ambulatory Visit: Payer: Self-pay

## 2016-08-15 MED ORDER — LEVOTHYROXINE SODIUM 125 MCG PO TABS: 125.0000 ug | ORAL_TABLET | Freq: Every day | ORAL | 2 refills | Status: DC

## 2016-08-15 NOTE — Telephone Encounter (Signed)
This was ordered 

## 2016-08-15 NOTE — Telephone Encounter (Signed)
Patient called asking why her medication was denied refill request (per pharmacy). levothyroxine (SYNTHROID, LEVOTHROID) 125 MCG tablet [[id number]]

## 2016-12-19 ENCOUNTER — Telehealth: Payer: Self-pay | Admitting: Endocrinology

## 2016-12-19 ENCOUNTER — Other Ambulatory Visit: Payer: Self-pay

## 2016-12-19 MED ORDER — LEVOTHYROXINE SODIUM 125 MCG PO TABS: 125.0000 ug | ORAL_TABLET | Freq: Every day | ORAL | 2 refills | Status: DC

## 2016-12-19 NOTE — Telephone Encounter (Signed)
Caller Name: Ravneet Spilker  Relationship to Patient: self  Best Number: [phone]  Pharmacy: [facility] on [address]   Reason for call: tp requesting refill on levothyroxine (SYNTHROID, LEVOTHROID) 125 MCG tablet [[id number]]   Last refilled on 08/15/16.  Last office visit 02/15/2016.  Patient is scheduled for future visit with Dr. Loanne Drilling on 02/14/2017  Pt is out of medication

## 2016-12-23 ENCOUNTER — Other Ambulatory Visit: Payer: Self-pay

## 2016-12-23 MED ORDER — LEVOTHYROXINE SODIUM 125 MCG PO TABS: 125.0000 ug | ORAL_TABLET | Freq: Every day | ORAL | 2 refills | Status: DC

## 2017-02-14 ENCOUNTER — Encounter: Payer: Self-pay | Admitting: Endocrinology

## 2017-02-14 ENCOUNTER — Ambulatory Visit: Payer: BLUE CROSS/BLUE SHIELD | Admitting: Endocrinology

## 2017-02-14 VITALS — BP 175/89 | HR 89 | Wt 241.0 lb

## 2017-02-14 DIAGNOSIS — E042 Nontoxic multinodular goiter: Secondary | ICD-10-CM

## 2017-02-14 LAB — TSH: TSH: 12.59 u[IU]/mL — AB (ref 0.35–4.50)

## 2017-02-14 NOTE — Patient Instructions (Signed)
Let's recheck the ultrasound.  you will receive a phone call, about a day and time for an appointment. blood tests are requested for you today.  We'll let you know about the results.   Please come back for a follow-up appointment in 1 year.  

## 2017-02-14 NOTE — Progress Notes (Signed)
Subjective:    Patient ID: Meredith Johnson, female    DOB: 09/24/1963, 56 y.o.   MRN: [id number]  HPI Pt returns for f/u of post-RAI hypothyroidism (in 2012, pt had RAI for hyperthyroidism, due to a multinodular goiter; f/u US in 2017 showed the goiter was slightly smaller).  She has slight swelling of the ant neck, and assoc pain.  She takes synthroid as rx'ed.   Past Medical History:  Diagnosis Date  . Anemia   . ANEMIA-NOS 11/27/2006  . BACK PAIN, LUMBAR 09/22/2009  . BENIGN NEOPLASM [place] SITE DIGESTIVE SYSTEM 03/19/2006  . GOITER, MULTINODULAR 08/03/2009  . HEMORRHOIDS 06/20/2007  . HIATAL HERNIA 03/19/2006  . Hypertension   . HYPERTENSION 11/27/2006  . Hypothyroidism 05/07/2010  . Insomnia   . Insomnia   . 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 PLANTAR FASCIITIS, RIGHT 07/13/2009  . SLEEP APNEA 06/20/2007    Past Surgical History:  Procedure Laterality Date  . ANKLE SURGERY     1995  . PANENDOSCOPY      Social History   Socioeconomic History  . Marital status: Single    Spouse name: Not on file  . Number of children: Not on file  . Years of education: Not on file  . Highest education level: Not on file  Social Needs  . Financial resource strain: Not on file  . Food insecurity - worry: Not on file  . Food insecurity - inability: Not on file  . Transportation needs - medical: Not on file  . Transportation needs - non-medical: Not on file  Occupational History  . 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 Smoking status: Never Smoker  . Smokeless tobacco: Never Used  Substance and Sexual Activity  . Alcohol use: No  . Drug use: No  . Sexual activity: Yes  Other Topics Concern  . Not on file  Social History Narrative  . Not on file    Current Outpatient Medications on File Prior to Visit  Medication Sig Dispense Refill  . Cholecalciferol (VITAMIN D3) 2000 units TABS Take by mouth.    .  Cyanocobalamin (VITAMIN B 12 PO) Take by mouth.    . diltiazem (CARDIZEM CD) 240 MG 24 hr capsule Take 240 mg by mouth daily.    . hydrochlorothiazide (HYDRODIURIL) 50 MG tablet Take 125 mg by mouth 2 (two) times a week.    . labetalol (NORMODYNE) 200 MG tablet Take 200 mg by mouth 2 (two) times daily.    . Zinc 50 MG CAPS Take by mouth.    . cloNIDine (CATAPRES - DOSED IN MG/24 HR) 0.2 mg/24hr patch Place 0.2 mg onto the skin once a week.    . diphenhydrAMINE (BENADRYL) 25 MG tablet Take 1 tablet (25 mg total) by mouth every 6 (six) hours as needed. (Patient not taking: Reported on 02/14/2017) 30 tablet 0  . famotidine (PEPCID) 20 MG tablet Take 1 tablet (20 mg total) by mouth 2 (two) times daily. (Patient not taking: Reported on 02/14/2017) 10 tablet 0  . spironolactone (ALDACTONE) 50 MG tablet Take 50 mg by mouth daily.    . valsartan-hydrochlorothiazide (DIOVAN-HCT) 160-25 MG tablet Take 1 tablet by mouth daily.     No current facility-administered medications on file prior to visit.     Allergies  Allergen Reactions  . Diclofenac Sodium   . Azor [Amlodipine-Olmesartan]   . Lisinopril     Hives and  swelling   . Norvasc [Amlodipine]     Hives and swelling     Family History  Problem Relation Age of Onset  . Goiter Mother        Resuction of a benign goiter  . Hyperlipidemia Mother   . Hypertension Mother   . Cancer Father   . Breast cancer Sister     BP (!) 175/89 (BP Location: Left Arm, Patient Position: Sitting, Cuff Size: Normal)   Pulse 89   Wt 241 lb (109.3 kg)   SpO2 95%   BMI 38.90 kg/m    Review of Systems She has fatigue.  Denies voice change.     Objective:   Physical Exam VITAL SIGNS:  See vs page GENERAL: no distress NECK: There is no palpable thyroid enlargement.  No thyroid nodule is palpable.  No palpable lymphadenopathy at the anterior neck.    Lab Results  Component Value Date   TSH 12.59 (H) 02/14/2017      Assessment & Plan:  Goiter: due  for recheck. Hypothyroidism: she needs increased rx.   Patient Instructions  Let's recheck the ultrasound.  you will receive a phone call, about a day and time for an appointment. blood tests are requested for you today.  We'll let you know about the results. Please come back for a follow-up appointment in 1 year.

## 2017-02-15 ENCOUNTER — Telehealth: Payer: Self-pay | Admitting: Endocrinology

## 2017-02-15 MED ORDER — LEVOTHYROXINE SODIUM 150 MCG PO TABS: 150.0000 ug | ORAL_TABLET | Freq: Every day | ORAL | 5 refills | Status: DC

## 2017-02-15 NOTE — Telephone Encounter (Signed)
please call patient: We need to increase the thyroid pill.  I have sent a prescription to your pharmacy

## 2017-02-17 ENCOUNTER — Other Ambulatory Visit: Payer: Self-pay

## 2017-02-17 MED ORDER — LEVOTHYROXINE SODIUM 150 MCG PO TABS: 150.0000 ug | ORAL_TABLET | Freq: Every day | ORAL | 5 refills | Status: DC

## 2017-02-17 NOTE — Telephone Encounter (Signed)
Patient was notified & prescription sent to correct pharmacy.

## 2017-02-21 ENCOUNTER — Ambulatory Visit
Admission: RE | Admit: 2017-02-21 | Discharge: 2017-02-21 | Disposition: A | Discharge status: Home / Self Care | Payer: BLUE CROSS/BLUE SHIELD | Source: Ambulatory Visit | Attending: Endocrinology | Admitting: Endocrinology

## 2017-02-21 DIAGNOSIS — E042 Nontoxic multinodular goiter: Secondary | ICD-10-CM

## 2017-02-27 ENCOUNTER — Telehealth: Payer: Self-pay | Admitting: Endocrinology

## 2017-02-27 NOTE — Telephone Encounter (Signed)
I do not prescribe this medication in my practice, as it is not safe to take

## 2017-02-27 NOTE — Telephone Encounter (Signed)
Pt called says Nodules found were discussed yesterday via phone but not discussed in detail. Please call pt [phone]. Also pt uses [facility] on [address] and would like to switch back to [place] thyroid medication instead of the synthroid. Pt states she does not feel right on synthroid but did well on Armour.

## 2017-02-27 NOTE — Telephone Encounter (Signed)
I called and notified patient that, that medication was not prescribed in this office. I also scheduled her for a 6 month f/u.

## 2017-02-27 NOTE — Telephone Encounter (Signed)
Please see note & advise if ok to switch mediations? I will call patient back with your response to result note from yesterday as well.

## 2017-03-27 ENCOUNTER — Telehealth: Payer: Self-pay | Admitting: Endocrinology

## 2017-03-27 ENCOUNTER — Encounter (INDEPENDENT_AMBULATORY_CARE_PROVIDER_SITE_OTHER): Payer: Self-pay

## 2017-03-27 NOTE — Telephone Encounter (Signed)
Patient stated she would like a copy of her ultra sound mailed to her.

## 2017-03-27 NOTE — Telephone Encounter (Signed)
I have printed & put in box up front to mail.

## 2017-07-29 ENCOUNTER — Ambulatory Visit: Payer: BLUE CROSS/BLUE SHIELD | Admitting: Endocrinology

## 2017-07-29 ENCOUNTER — Encounter: Payer: Self-pay | Admitting: Endocrinology

## 2017-07-29 VITALS — BP 152/74 | HR 74 | Ht 66.0 in | Wt 222.0 lb

## 2017-07-29 DIAGNOSIS — E042 Nontoxic multinodular goiter: Secondary | ICD-10-CM

## 2017-07-29 LAB — TSH: TSH: 11.43 u[IU]/mL — ABNORMAL HIGH (ref 0.35–4.50)

## 2017-07-29 NOTE — Patient Instructions (Addendum)
Let's recheck the ultrasound.  you will receive a phone call, about a day and time for an appointment. blood tests are requested for you today.  We'll let you know about the results.  It is best to not miss the thyroid pill.  However, if you miss a day, you can take 2 the next day.   Please come back for a follow-up appointment in 1 year.

## 2017-07-29 NOTE — Progress Notes (Signed)
Subjective:    Patient ID: Meredith Johnson, female    DOB: 09/24/1963, 57 y.o.   MRN: [id number]  HPI Pt returns for f/u of post-RAI hypothyroidism (in 2012, pt had RAI for hyperthyroidism, due to a multinodular goiter; f/u US in 2017 showed the goiter was slightly smaller).  She again reports slight swelling of the ant neck, and assoc pain.  She sometimes misses the synthroid.  In early 2019, it was reduced back to 125/d.  Past Medical History:  Diagnosis Date  . Anemia   . ANEMIA-NOS 11/27/2006  . BACK PAIN, LUMBAR 09/22/2009  . BENIGN NEOPLASM [place] SITE DIGESTIVE SYSTEM 03/19/2006  . GOITER, MULTINODULAR 08/03/2009  . HEMORRHOIDS 06/20/2007  . HIATAL HERNIA 03/19/2006  . Hypertension   . HYPERTENSION 11/27/2006  . Hypothyroidism 05/07/2010  . Insomnia   . Insomnia   . 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 PLANTAR FASCIITIS, RIGHT 07/13/2009  . SLEEP APNEA 06/20/2007    Past Surgical History:  Procedure Laterality Date  . ANKLE SURGERY     1995  . PANENDOSCOPY      Social History   Socioeconomic History  . Marital status: Single    Spouse name: Not on file  . Number of children: Not on file  . Years of education: Not on file  . Highest education level: Not on file  Occupational History  . 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 Financial resource strain: Not on file  . Food insecurity:    Worry: Not on file    Inability: Not on file  . Transportation needs:    Medical: Not on file    Non-medical: Not on file  Tobacco Use  . Smoking status: Never Smoker  . Smokeless tobacco: Never Used  Substance and Sexual Activity  . Alcohol use: No  . Drug use: No  . Sexual activity: Yes  Lifestyle  . Physical activity:    Days per week: Not on file    Minutes per session: Not on file  . Stress: Not on file  Relationships  . Social connections:    Talks on phone: Not on file    Gets together: Not on  file    Attends religious service: Not on file    Active member of club or organization: Not on file    Attends meetings of clubs or organizations: Not on file    Relationship status: Not on file  . Intimate partner violence:    Fear of current or ex partner: Not on file    Emotionally abused: Not on file    Physically abused: Not on file    Forced sexual activity: Not on file  Other Topics Concern  . Not on file  Social History Narrative  . Not on file    Current Outpatient Medications on File Prior to Visit  Medication Sig Dispense Refill  . amLODipine (NORVASC) 10 MG tablet 1 tablet daily.  3  . Cholecalciferol (VITAMIN D3) 2000 units TABS Take by mouth.    . Cyanocobalamin (VITAMIN B 12 PO) Take by mouth.    . diphenhydrAMINE (BENADRYL) 25 MG tablet Take 1 tablet (25 mg total) by mouth every 6 (six) hours as needed. 30 tablet 0  . hydrochlorothiazide (HYDRODIURIL) 50 MG tablet Take 125 mg by mouth daily.     Marland Kitchen labetalol (NORMODYNE) 200 MG tablet Take 200 mg by mouth 2 (two) times daily.    Marland Kitchen  levothyroxine (SYNTHROID) 125 MCG tablet Take 125 mcg by mouth daily before breakfast.    . Zinc 50 MG CAPS Take by mouth.     No current facility-administered medications on file prior to visit.     Allergies  Allergen Reactions  . Diclofenac Sodium   . Azor [Amlodipine-Olmesartan]   . Lisinopril     Hives and swelling     Family History  Problem Relation Age of Onset  . Goiter Mother        Resuction of a benign goiter  . Hyperlipidemia Mother   . Hypertension Mother   . Cancer Father   . Breast cancer Sister     BP (!) 152/74 (BP Location: Left Arm, Patient Position: Sitting, Cuff Size: Large)   Pulse 74   Ht 5\' 6"  (1.676 m)   Wt 222 lb (100.7 kg)   SpO2 95%   BMI 35.83 kg/m    Review of Systems Denies sob and tremor.     Objective:   Physical Exam VITAL SIGNS:  See vs page.  GENERAL: no distress.  NECK: There is no palpable thyroid enlargement.  No thyroid  nodule is palpable.  No palpable lymphadenopathy at the anterior neck.        Assessment & Plan:  Hypothyroidism: recheck today Med noncompliance Multinodular goiter due for recheck.  Patient Instructions  Let's recheck the ultrasound.  you will receive a phone call, about a day and time for an appointment. blood tests are requested for you today.  We'll let you know about the results.  It is best to not miss the thyroid pill.  However, if you miss a day, you can take 2 the next day.   Please come back for a follow-up appointment in 1 year.

## 2017-08-02 ENCOUNTER — Ambulatory Visit (HOSPITAL_BASED_OUTPATIENT_CLINIC_OR_DEPARTMENT_OTHER)
Admission: RE | Admit: 2017-08-02 | Discharge: 2017-08-02 | Disposition: A | Discharge status: Home / Self Care | Payer: BLUE CROSS/BLUE SHIELD | Source: Ambulatory Visit | Attending: Endocrinology | Admitting: Endocrinology

## 2017-08-02 DIAGNOSIS — E042 Nontoxic multinodular goiter: Secondary | ICD-10-CM | POA: Diagnosis present

## 2017-09-01 ENCOUNTER — Other Ambulatory Visit: Payer: BLUE CROSS/BLUE SHIELD

## 2017-09-12 ENCOUNTER — Other Ambulatory Visit: Payer: Self-pay

## 2018-01-07 ENCOUNTER — Other Ambulatory Visit: Payer: Self-pay | Admitting: Obstetrics and Gynecology

## 2018-01-07 DIAGNOSIS — Z1231 Encounter for screening mammogram for malignant neoplasm of breast: Secondary | ICD-10-CM

## 2018-02-13 ENCOUNTER — Ambulatory Visit: Payer: BLUE CROSS/BLUE SHIELD | Admitting: Endocrinology

## 2018-02-19 ENCOUNTER — Ambulatory Visit
Admission: RE | Admit: 2018-02-19 | Discharge: 2018-02-19 | Disposition: A | Discharge status: Home / Self Care | Payer: BLUE CROSS/BLUE SHIELD | Source: Ambulatory Visit | Attending: Obstetrics and Gynecology | Admitting: Obstetrics and Gynecology

## 2018-02-19 DIAGNOSIS — Z1231 Encounter for screening mammogram for malignant neoplasm of breast: Secondary | ICD-10-CM

## 2018-07-29 ENCOUNTER — Other Ambulatory Visit: Payer: Self-pay

## 2018-07-29 ENCOUNTER — Encounter: Payer: Self-pay | Admitting: Endocrinology

## 2018-07-30 ENCOUNTER — Ambulatory Visit (INDEPENDENT_AMBULATORY_CARE_PROVIDER_SITE_OTHER): Payer: BLUE CROSS/BLUE SHIELD | Admitting: Endocrinology

## 2018-07-30 DIAGNOSIS — M542 Cervicalgia: Secondary | ICD-10-CM

## 2018-07-30 DIAGNOSIS — E890 Postprocedural hypothyroidism: Secondary | ICD-10-CM | POA: Diagnosis not present

## 2018-07-30 DIAGNOSIS — E042 Nontoxic multinodular goiter: Secondary | ICD-10-CM

## 2018-07-30 NOTE — Progress Notes (Signed)
Subjective:    Patient ID: Meredith Johnson, female    DOB: 09/24/1963, 58 y.o.   MRN: [id number]  HPI  telehealth visit today via doxy video visit.  Alternatives to telehealth are presented to this patient, and the patient agrees to the telehealth visit. Pt is advised of the cost of the visit, and agrees to this, also.   Patient is at home, and I am at the office.   Persons attending the telehealth visit: the patient and I Pt returns for f/u of post-RAI hypothyroidism (in 2012, pt had RAI for hyperthyroidism, due to a multinodular goiter; f/u US in 2019 showed the goiter was slightly smaller (no nodule needing bx); she saw ENT for discomfort at the ant neck, but no cause was found).  She again reports slight discomfort at the ant neck, and assoc left ear pain.  She sometimes misses the synthroid.  Pt says current synthroid is 137 mcg/day.   Past Medical History:  Diagnosis Date  . Anemia   . ANEMIA-NOS 11/27/2006  . BACK PAIN, LUMBAR 09/22/2009  . BENIGN NEOPLASM [place] SITE DIGESTIVE SYSTEM 03/19/2006  . GOITER, MULTINODULAR 08/03/2009  . HEMORRHOIDS 06/20/2007  . HIATAL HERNIA 03/19/2006  . Hypertension   . HYPERTENSION 11/27/2006  . Hypothyroidism 05/07/2010  . Insomnia   . Insomnia   . 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 PLANTAR FASCIITIS, RIGHT 07/13/2009  . SLEEP APNEA 06/20/2007    Past Surgical History:  Procedure Laterality Date  . ANKLE SURGERY     1995  . PANENDOSCOPY      Social History   Socioeconomic History  . Marital status: Single    Spouse name: Not on file  . Number of children: Not on file  . Years of education: Not on file  . Highest education level: Not on file  Occupational History  . 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 Financial resource strain: Not on file  . Food insecurity:    Worry: Not on file    Inability: Not on file  . Transportation needs:    Medical: Not  on file    Non-medical: Not on file  Tobacco Use  . Smoking status: Never Smoker  . Smokeless tobacco: Never Used  Substance and Sexual Activity  . Alcohol use: No  . Drug use: No  . Sexual activity: Yes  Lifestyle  . Physical activity:    Days per week: Not on file    Minutes per session: Not on file  . Stress: Not on file  Relationships  . Social connections:    Talks on phone: Not on file    Gets together: Not on file    Attends religious service: Not on file    Active member of club or organization: Not on file    Attends meetings of clubs or organizations: Not on file    Relationship status: Not on file  . Intimate partner violence:    Fear of current or ex partner: Not on file    Emotionally abused: Not on file    Physically abused: Not on file    Forced sexual activity: Not on file  Other Topics Concern  . Not on file  Social History Narrative  . Not on file    Current Outpatient Medications on File Prior to Visit  Medication Sig Dispense Refill  . amLODipine (NORVASC) 10 MG tablet 1 tablet daily.  3  .  Cholecalciferol (VITAMIN D3) 2000 units TABS Take by mouth.    . Cyanocobalamin (VITAMIN B 12 PO) Take by mouth.    . diphenhydrAMINE (BENADRYL) 25 MG tablet Take 1 tablet (25 mg total) by mouth every 6 (six) hours as needed. 30 tablet 0  . hydrochlorothiazide (HYDRODIURIL) 50 MG tablet Take 125 mg by mouth daily.     Marland Kitchen labetalol (NORMODYNE) 200 MG tablet Take 200 mg by mouth 2 (two) times daily.    Marland Kitchen levothyroxine (SYNTHROID) 125 MCG tablet Take 125 mcg by mouth daily before breakfast.    . Zinc 50 MG CAPS Take by mouth.     No current facility-administered medications on file prior to visit.     Allergies  Allergen Reactions  . Diclofenac Sodium   . Azor [Amlodipine-Olmesartan]   . Lisinopril     Hives and swelling     Family History  Problem Relation Age of Onset  . Goiter Mother        Resuction of a benign goiter  . Hyperlipidemia Mother   .  Hypertension Mother   . Cancer Father   . Breast cancer Sister      Review of Systems Denies fever.      Objective:   Physical Exam       Assessment & Plan:  Hypothyroidism: due for recheck.  MNG: I told pt this is to small to cause sxs.  Neck sxs, persistent: I am checking TPO ab at pt's request.  Patient Instructions  Blood tests are requested for you today.  We'll let you know about the results.   Please come back for a follow-up appointment in 1 year.

## 2018-07-30 NOTE — Patient Instructions (Signed)
Blood tests are requested for you today.  We'll let you know about the results.  Please come back for a follow-up appointment in 1 year.    

## 2018-12-23 ENCOUNTER — Encounter: Payer: Self-pay | Admitting: Endocrinology

## 2018-12-23 ENCOUNTER — Ambulatory Visit (INDEPENDENT_AMBULATORY_CARE_PROVIDER_SITE_OTHER): Payer: BLUE CROSS/BLUE SHIELD | Admitting: Endocrinology

## 2018-12-23 ENCOUNTER — Other Ambulatory Visit: Payer: Self-pay

## 2018-12-23 VITALS — BP 132/70 | HR 79 | Ht 66.0 in | Wt 241.4 lb

## 2018-12-23 DIAGNOSIS — E890 Postprocedural hypothyroidism: Secondary | ICD-10-CM | POA: Diagnosis not present

## 2018-12-23 LAB — TSH: TSH: 7.33 mIU/L — ABNORMAL HIGH (ref 0.40–4.50)

## 2018-12-23 LAB — T4, FREE: Free T4: 1.1 ng/dL (ref 0.8–1.8)

## 2018-12-23 NOTE — Patient Instructions (Signed)
Blood tests are requested for you today.  We'll let you know about the results.  Please come back for a follow-up appointment in 6 months.   

## 2018-12-23 NOTE — Progress Notes (Signed)
Subjective:    Patient ID: Meredith Johnson, female    DOB: 09/24/1963, 58 y.o.   MRN: [id number]  HPI Pt returns for f/u of post-RAI hypothyroidism (in 2012, pt had RAI for hyperthyroidism, due to a multinodular goiter; f/u US in 2019 showed the goiter was slightly smaller (no nodule needing bx); she saw ENT for discomfort at the ant neck, but no cause was found; she has chronic left ear pain--ENT eval was neg).  She again reports slight pain at the ant neck, but no assoc swelling.  She never misses the synthroid, but she says the dosage is 137 mcg/d. Past Medical History:  Diagnosis Date  . Anemia   . ANEMIA-NOS 11/27/2006  . BACK PAIN, LUMBAR 09/22/2009  . BENIGN NEOPLASM [place] SITE DIGESTIVE SYSTEM 03/19/2006  . GOITER, MULTINODULAR 08/03/2009  . HEMORRHOIDS 06/20/2007  . HIATAL HERNIA 03/19/2006  . Hypertension   . HYPERTENSION 11/27/2006  . Hypothyroidism 05/07/2010  . Insomnia   . Insomnia   . 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 PLANTAR FASCIITIS, RIGHT 07/13/2009  . SLEEP APNEA 06/20/2007    Past Surgical History:  Procedure Laterality Date  . ANKLE SURGERY     1995  . PANENDOSCOPY      Social History   Socioeconomic History  . Marital status: Single    Spouse name: Not on file  . Number of children: Not on file  . Years of education: Not on file  . Highest education level: Not on file  Occupational History  . 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 Financial resource strain: Not on file  . Food insecurity    Worry: Not on file    Inability: Not on file  . Transportation needs    Medical: Not on file    Non-medical: Not on file  Tobacco Use  . Smoking status: Never Smoker  . Smokeless tobacco: Never Used  Substance and Sexual Activity  . Alcohol use: No  . Drug use: No  . Sexual activity: Yes  Lifestyle  . Physical activity    Days per week: Not on file    Minutes per session:  Not on file  . Stress: Not on file  Relationships  . Social Herbalist on phone: Not on file    Gets together: Not on file    Attends religious service: Not on file    Active member of club or organization: Not on file    Attends meetings of clubs or organizations: Not on file    Relationship status: Not on file  . Intimate partner violence    Fear of current or ex partner: Not on file    Emotionally abused: Not on file    Physically abused: Not on file    Forced sexual activity: Not on file  Other Topics Concern  . Not on file  Social History Narrative  . Not on file    Current Outpatient Medications on File Prior to Visit  Medication Sig Dispense Refill  . amLODipine (NORVASC) 10 MG tablet 1 tablet daily.  3  . Cholecalciferol (VITAMIN D3) 2000 units TABS Take by mouth.    . Cyanocobalamin (VITAMIN B 12 PO) Take by mouth.    . diphenhydrAMINE (BENADRYL) 25 MG tablet Take 1 tablet (25 mg total) by mouth every 6 (six) hours as needed. 30 tablet 0  . hydrochlorothiazide (HYDRODIURIL) 50 MG tablet Take  125 mg by mouth daily.     Marland Kitchen labetalol (NORMODYNE) 200 MG tablet Take 200 mg by mouth 2 (two) times daily.    . Zinc 50 MG CAPS Take by mouth.     No current facility-administered medications on file prior to visit.     Allergies  Allergen Reactions  . Diclofenac Sodium   . Azor [Amlodipine-Olmesartan]   . Lisinopril     Hives and swelling     Family History  Problem Relation Age of Onset  . Goiter Mother        Resuction of a benign goiter  . Hyperlipidemia Mother   . Hypertension Mother   . Cancer Father   . Breast cancer Sister     BP 132/70 (BP Location: Left Wrist, Patient Position: Sitting, Cuff Size: Large)   Pulse 79   Ht 5\' 6"  (1.676 m)   Wt 241 lb 6.4 oz (109.5 kg)   SpO2 95%   BMI 38.96 kg/m    Review of Systems She has dry skin and itching.      Objective:   Physical Exam VITAL SIGNS:  See vs page GENERAL: no distress NECK: There  is no palpable thyroid enlargement.  No thyroid nodule is palpable.  No palpable lymphadenopathy at the anterior neck.     Lab Results  Component Value Date   TSH 7.33 (H) 12/23/2018       Assessment & Plan:  Hypothyroidism: she needs increased rx Neck sxs, worse.  We discussed the fact that ENT eval was neg, and PE today is normal.  She declines further w/u now  Patient Instructions  Blood tests are requested for you today.  We'll let you know about the results.  Please come back for a follow-up appointment in 6 months.

## 2018-12-24 MED ORDER — LEVOTHYROXINE SODIUM 150 MCG PO TABS: 150.0000 ug | ORAL_TABLET | Freq: Every day | ORAL | 3 refills | Status: DC

## 2018-12-31 ENCOUNTER — Telehealth: Payer: Self-pay

## 2018-12-31 NOTE — Telephone Encounter (Signed)
Patient called in the pharmacy of  [facility] she wants to use [facility]

## 2019-01-01 ENCOUNTER — Other Ambulatory Visit: Payer: Self-pay

## 2019-01-01 DIAGNOSIS — E042 Nontoxic multinodular goiter: Secondary | ICD-10-CM

## 2019-01-01 MED ORDER — LEVOTHYROXINE SODIUM 150 MCG PO TABS: 150.0000 ug | ORAL_TABLET | Freq: Every day | ORAL | 3 refills | Status: DC

## 2019-01-01 NOTE — Telephone Encounter (Signed)
levothyroxine (SYNTHROID) 150 MCG tablet 90 tablet 3 01/01/2019    Sig - Route: Take 1 tablet (150 mcg total) by mouth daily. - Oral   Sent to pharmacy as: levothyroxine (SYNTHROID) 150 MCG tablet   E-Prescribing Status: Receipt confirmed by pharmacy (01/01/2019  7:12 AM EDT)    [id number] [place], [place]

## 2019-02-19 ENCOUNTER — Other Ambulatory Visit: Payer: Self-pay

## 2019-02-23 ENCOUNTER — Encounter: Payer: Self-pay | Admitting: Endocrinology

## 2019-02-23 ENCOUNTER — Ambulatory Visit (INDEPENDENT_AMBULATORY_CARE_PROVIDER_SITE_OTHER): Payer: BC Managed Care – PPO | Admitting: Endocrinology

## 2019-02-23 ENCOUNTER — Other Ambulatory Visit: Payer: Self-pay

## 2019-02-23 VITALS — BP 122/76 | HR 78 | Ht 66.0 in | Wt 239.0 lb

## 2019-02-23 DIAGNOSIS — H9202 Otalgia, left ear: Secondary | ICD-10-CM | POA: Diagnosis not present

## 2019-02-23 DIAGNOSIS — E890 Postprocedural hypothyroidism: Secondary | ICD-10-CM | POA: Diagnosis not present

## 2019-02-23 NOTE — Progress Notes (Signed)
Subjective:    Meredith Johnson ID: Meredith Johnson, female    DOB: 09/24/1963, 58 y.o.   MRN: [id number]  HPI Pt returns for f/u of post-RAI hypothyroidism (in 2012, pt had RAI for hyperthyroidism, due to a multinodular goiter; f/u US in 2019 showed the goiter was slightly smaller (no nodule needing bx); she saw ENT for discomfort at the ant neck, but no cause was found; she has chronic left ear pain--ENT eval was neg).  She takes synthroid as rx'ed.  pt states she feels well in general, except for fatigue.  Pt requests to have iodine level.  Pt states 1 month of slight left ear pain, but no assoc drainage.   Past Medical History:  Diagnosis Date  . Anemia   . ANEMIA-NOS 11/27/2006  . BACK PAIN, LUMBAR 09/22/2009  . BENIGN NEOPLASM [place] SITE DIGESTIVE SYSTEM 03/19/2006  . GOITER, MULTINODULAR 08/03/2009  . HEMORRHOIDS 06/20/2007  . HIATAL HERNIA 03/19/2006  . Hypertension   . HYPERTENSION 11/27/2006  . Hypothyroidism 05/07/2010  . Insomnia   . Insomnia   . 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 PLANTAR FASCIITIS, RIGHT 07/13/2009  . SLEEP APNEA 06/20/2007    Past Surgical History:  Procedure Laterality Date  . ANKLE SURGERY     1995  . PANENDOSCOPY      Social History   Socioeconomic History  . Marital status: Single    Spouse name: Not on file  . Number of children: Not on file  . Years of education: Not on file  . Highest education level: Not on file  Occupational History  . 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 Smoking status: Never Smoker  . Smokeless tobacco: Never Used  Substance and Sexual Activity  . Alcohol use: No  . Drug use: No  . Sexual activity: Yes  Other Topics Concern  . Not on file  Social History Narrative  . Not on file   Social Determinants of Health   Financial Resource Strain:   . Difficulty of Paying Living Expenses: Not on file  Food Insecurity:   . Worried About  Charity fundraiser in the Last Year: Not on file  . Ran Out of Food in the Last Year: Not on file  Transportation Needs:   . Lack of Transportation (Medical): Not on file  . Lack of Transportation (Non-Medical): Not on file  Physical Activity:   . Days of Exercise per Week: Not on file  . Minutes of Exercise per Session: Not on file  Stress:   . Feeling of Stress : Not on file  Social Connections:   . Frequency of Communication with Friends and Family: Not on file  . Frequency of Social Gatherings with Friends and Family: Not on file  . Attends Religious Services: Not on file  . Active Member of Clubs or Organizations: Not on file  . Attends Archivist Meetings: Not on file  . Marital Status: Not on file  Intimate Partner Violence:   . Fear of Current or Ex-Partner: Not on file  . Emotionally Abused: Not on file  . Physically Abused: Not on file  . Sexually Abused: Not on file    Current Outpatient Medications on File Prior to Visit  Medication Sig Dispense Refill  . amLODipine (NORVASC) 10 MG tablet 1 tablet daily.  3  . Cholecalciferol (VITAMIN D3) 2000 units TABS Take by mouth.    Marland Kitchen  Cyanocobalamin (VITAMIN B 12 PO) Take by mouth.    . diphenhydrAMINE (BENADRYL) 25 MG tablet Take 1 tablet (25 mg total) by mouth every 6 (six) hours as needed. 30 tablet 0  . hydrochlorothiazide (HYDRODIURIL) 50 MG tablet Take 125 mg by mouth daily.     Marland Kitchen labetalol (NORMODYNE) 200 MG tablet Take 200 mg by mouth 2 (two) times daily.    . Zinc 50 MG CAPS Take by mouth.     No current facility-administered medications on file prior to visit.    Allergies  Allergen Reactions  . Diclofenac Sodium   . Azor [Amlodipine-Olmesartan]   . Lisinopril     Hives and swelling     Family History  Problem Relation Age of Onset  . Goiter Mother        Resuction of a benign goiter  . Hyperlipidemia Mother   . Hypertension Mother   . Cancer Father   . Breast cancer Sister     BP 122/76  (BP Location: Left Arm, Meredith Johnson Position: Sitting, Cuff Size: Large)   Pulse 78   Ht 5\' 6"  (1.676 m)   Wt 239 lb (108.4 kg)   SpO2 95%   BMI 38.58 kg/m    Review of Systems Denies palpitations and fever.      Objective:   Physical Exam VITAL SIGNS:  See vs page GENERAL: no distress NECK: There is no palpable thyroid enlargement.  No thyroid nodule is palpable.  No palpable lymphadenopathy at the anterior neck.   EARS: left eac and TM are normal  Lab Results  Component Value Date   TSH 6.65 (H) 02/23/2019       Assessment & Plan:  Ear sxs, new, uncertain etiology Hypothyroidism: she needs increased rx.  I have sent a prescription to your pharmacy, to increase  Meredith Johnson Instructions  Blood tests are requested for you today.  We'll let you know about the results.   Please as Dr Sunny Schlein, about your ear symptoms, as I don't know the cause.   Please come back for a follow-up appointment in 6 months.

## 2019-02-23 NOTE — Patient Instructions (Addendum)
Blood tests are requested for you today.  We'll let you know about the results.   Please as Dr Sunny Schlein, about your ear symptoms, as I don't know the cause.   Please come back for a follow-up appointment in 6 months.

## 2019-02-24 ENCOUNTER — Telehealth: Payer: Self-pay

## 2019-02-24 MED ORDER — LEVOTHYROXINE SODIUM 175 MCG PO TABS: 175.0000 ug | ORAL_TABLET | Freq: Every day | ORAL | 2 refills | Status: DC

## 2019-02-24 NOTE — Telephone Encounter (Signed)
Lab results reviewed by Dr. Ellison. Called pt to inform about lab results as well as new orders. Using closed-loop communication, pt verbalized complete acceptance and understanding of all information provided. No further questions nor concerns were voiced at this time. 

## 2019-02-24 NOTE — Telephone Encounter (Signed)
-----   Message from Renato Shin, MD sent at 02/24/2019 12:13 PM EST ----- please contact patient: We need to increase the levothyroxine again.  I have sent a prescription to your pharmacy. I'll see you next time.

## 2019-02-25 LAB — IODINE, SERUM/PLASMA: Iodine: 58 mcg/L (ref 52–109)

## 2019-02-26 ENCOUNTER — Telehealth: Payer: Self-pay

## 2019-02-26 LAB — TSH: TSH: 6.65 mIU/L — ABNORMAL HIGH (ref 0.40–4.50)

## 2019-02-26 LAB — IODINE, SERUM/PLASMA

## 2019-02-26 LAB — T4, FREE: Free T4: 1.1 ng/dL (ref 0.8–1.8)

## 2019-02-26 NOTE — Telephone Encounter (Signed)
-----   Message from Renato Shin, MD sent at 02/25/2019  5:27 PM EST ----- please contact patient:  Iodine is normal--good.

## 2019-02-26 NOTE — Telephone Encounter (Signed)
Lab results reviewed by Dr. Ellison. Letter has been mailed. For future reference, letter can be found in Epic. 

## 2019-03-24 ENCOUNTER — Other Ambulatory Visit: Payer: Self-pay | Admitting: Obstetrics and Gynecology

## 2019-03-24 DIAGNOSIS — Z1231 Encounter for screening mammogram for malignant neoplasm of breast: Secondary | ICD-10-CM

## 2019-05-03 ENCOUNTER — Ambulatory Visit: Payer: BC Managed Care – PPO

## 2019-05-05 ENCOUNTER — Ambulatory Visit: Payer: BC Managed Care – PPO

## 2019-06-01 ENCOUNTER — Other Ambulatory Visit: Payer: Self-pay

## 2019-06-01 ENCOUNTER — Ambulatory Visit
Admission: RE | Admit: 2019-06-01 | Discharge: 2019-06-01 | Disposition: A | Discharge status: Home / Self Care | Payer: BC Managed Care – PPO | Source: Ambulatory Visit | Attending: Obstetrics and Gynecology | Admitting: Obstetrics and Gynecology

## 2019-06-01 ENCOUNTER — Ambulatory Visit: Payer: BC Managed Care – PPO

## 2019-06-01 DIAGNOSIS — Z1231 Encounter for screening mammogram for malignant neoplasm of breast: Secondary | ICD-10-CM

## 2019-06-24 ENCOUNTER — Ambulatory Visit: Payer: BLUE CROSS/BLUE SHIELD | Admitting: Endocrinology

## 2019-08-09 ENCOUNTER — Encounter: Payer: Self-pay | Admitting: Internal Medicine

## 2019-08-13 ENCOUNTER — Other Ambulatory Visit: Payer: Self-pay

## 2019-08-18 ENCOUNTER — Other Ambulatory Visit: Payer: Self-pay

## 2019-08-18 ENCOUNTER — Ambulatory Visit (INDEPENDENT_AMBULATORY_CARE_PROVIDER_SITE_OTHER): Payer: BC Managed Care – PPO | Admitting: Endocrinology

## 2019-08-18 ENCOUNTER — Encounter: Payer: Self-pay | Admitting: Endocrinology

## 2019-08-18 VITALS — BP 116/70 | HR 75 | Ht 66.0 in | Wt 235.0 lb

## 2019-08-18 DIAGNOSIS — E890 Postprocedural hypothyroidism: Secondary | ICD-10-CM | POA: Diagnosis not present

## 2019-08-18 LAB — T4, FREE: Free T4: 1.3 ng/dL (ref 0.8–1.8)

## 2019-08-18 LAB — TSH: TSH: 3.33 mIU/L (ref 0.40–4.50)

## 2019-08-18 NOTE — Patient Instructions (Addendum)
Thyroid blood tests are requested for you today.  We'll let you know about the results.   It is best to never miss the medication.  However, if you do miss it, next best is to double up the next time.   Please come back for a follow-up appointment in 6 months.

## 2019-08-18 NOTE — Progress Notes (Signed)
Subjective:    Patient ID: Herb Grays, female    DOB: [birth year], 59 y.o.   MRN: [id number]  HPI Pt returns for f/u of post-RAI hypothyroidism (in 2012, pt had RAI for hyperthyroidism, due to a multinodular goiter; f/u US in 2019 showed the goiter was slightly smaller (no nodule needing bx or f/u); she saw ENT for discomfort at the ant neck, but no cause was found; she has chronic left ear pain--ENT eval was neg).  She takes synthroid as rx'ed.  pt states she feels well in general, except for fatigue.   Past Medical History:  Diagnosis Date  . Anemia   . ANEMIA-NOS 11/27/2006  . BACK PAIN, LUMBAR 09/22/2009  . BENIGN NEOPLASM [place] SITE DIGESTIVE SYSTEM 03/19/2006  . GOITER, MULTINODULAR 08/03/2009  . HEMORRHOIDS 06/20/2007  . HIATAL HERNIA 03/19/2006  . Hypertension   . HYPERTENSION 11/27/2006  . Hypothyroidism 05/07/2010  . Insomnia   . Insomnia   . 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 PLANTAR FASCIITIS, RIGHT 07/13/2009  . SLEEP APNEA 06/20/2007    Past Surgical History:  Procedure Laterality Date  . ANKLE SURGERY     1995  . PANENDOSCOPY      Social History   Socioeconomic History  . Marital status: Single    Spouse name: Not on file  . Number of children: Not on file  . Years of education: Not on file  . Highest education level: Not on file  Occupational History  . 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 Smoking status: Never Smoker  . Smokeless tobacco: Never Used  Substance and Sexual Activity  . Alcohol use: No  . Drug use: No  . Sexual activity: Yes  Other Topics Concern  . Not on file  Social History Narrative  . Not on file   Social Determinants of Health   Financial Resource Strain:   . Difficulty of Paying Living Expenses:   Food Insecurity:   . Worried About Charity fundraiser in the Last Year:   . Arboriculturist in the Last Year:   Transportation Needs:   . Consulting civil engineer (Medical):   Marland Kitchen Lack of Transportation (Non-Medical):   Physical Activity:   . Days of Exercise per Week:   . Minutes of Exercise per Session:   Stress:   . Feeling of Stress :   Social Connections:   . Frequency of Communication with Friends and Family:   . Frequency of Social Gatherings with Friends and Family:   . Attends Religious Services:   . Active Member of Clubs or Organizations:   . Attends Archivist Meetings:   Marland Kitchen Marital Status:   Intimate Partner Violence:   . Fear of Current or Ex-Partner:   . Emotionally Abused:   Marland Kitchen Physically Abused:   . Sexually Abused:     Current Outpatient Medications on File Prior to Visit  Medication Sig Dispense Refill  . amLODipine (NORVASC) 10 MG tablet 1 tablet daily.  3  . Cholecalciferol (VITAMIN D3) 2000 units TABS Take by mouth.    . Cyanocobalamin (VITAMIN B 12 PO) Take by mouth.    . diphenhydrAMINE (BENADRYL) 25 MG tablet Take 1 tablet (25 mg total) by mouth every 6 (six) hours as needed. 30 tablet 0  . hydrochlorothiazide (HYDRODIURIL) 50 MG tablet Take 125 mg by mouth daily.     Marland Kitchen labetalol (NORMODYNE) 200  MG tablet Take 200 mg by mouth 2 (two) times daily.    Marland Kitchen levothyroxine (SYNTHROID) 175 MCG tablet Take 1 tablet (175 mcg total) by mouth daily before breakfast. 90 tablet 2  . Zinc 50 MG CAPS Take by mouth.     No current facility-administered medications on file prior to visit.    Allergies  Allergen Reactions  . Diclofenac Sodium   . Azor [Amlodipine-Olmesartan]   . Lisinopril     Hives and swelling     Family History  Problem Relation Age of Onset  . Goiter Mother        Resuction of a benign goiter  . Hyperlipidemia Mother   . Hypertension Mother   . Cancer Father   . Breast cancer Sister     BP 116/70   Pulse 75   Ht 5\' 6"  (1.676 m)   Wt 235 lb (106.6 kg)   SpO2 95%   BMI 37.93 kg/m    Review of Systems She has lost a few lbs since last ov.      Objective:   Physical  Exam VITAL SIGNS:  See vs page GENERAL: no distress NECK: There is no palpable thyroid enlargement.  No thyroid nodule is palpable.  No palpable lymphadenopathy at the anterior neck.    Lab Results  Component Value Date   TSH 3.33 08/18/2019       Assessment & Plan:  Hypothyroidism: well-replaced Please continue the same medication Please come back for a follow-up appointment in 6 months

## 2019-09-21 ENCOUNTER — Encounter: Payer: BC Managed Care – PPO | Admitting: Internal Medicine

## 2019-11-24 ENCOUNTER — Other Ambulatory Visit: Payer: Self-pay | Admitting: Endocrinology

## 2020-02-17 ENCOUNTER — Ambulatory Visit: Payer: BC Managed Care – PPO | Admitting: Endocrinology

## 2020-06-09 ENCOUNTER — Other Ambulatory Visit: Payer: Self-pay | Admitting: Obstetrics and Gynecology

## 2020-06-09 DIAGNOSIS — Z1231 Encounter for screening mammogram for malignant neoplasm of breast: Secondary | ICD-10-CM

## 2020-08-01 ENCOUNTER — Other Ambulatory Visit: Payer: Self-pay

## 2020-08-01 ENCOUNTER — Ambulatory Visit
Admission: RE | Admit: 2020-08-01 | Discharge: 2020-08-01 | Disposition: A | Discharge status: Home / Self Care | Payer: BC Managed Care – PPO | Source: Ambulatory Visit | Attending: Obstetrics and Gynecology | Admitting: Obstetrics and Gynecology

## 2020-08-01 DIAGNOSIS — Z1231 Encounter for screening mammogram for malignant neoplasm of breast: Secondary | ICD-10-CM

## 2021-07-11 ENCOUNTER — Other Ambulatory Visit: Payer: Self-pay | Admitting: Gastroenterology

## 2021-09-11 ENCOUNTER — Other Ambulatory Visit: Payer: Self-pay | Admitting: Obstetrics and Gynecology

## 2021-09-11 DIAGNOSIS — Z1231 Encounter for screening mammogram for malignant neoplasm of breast: Secondary | ICD-10-CM

## 2021-09-13 ENCOUNTER — Ambulatory Visit
Admission: RE | Admit: 2021-09-13 | Discharge: 2021-09-13 | Disposition: A | Discharge status: Home / Self Care | Payer: BC Managed Care – PPO | Source: Ambulatory Visit | Attending: Obstetrics and Gynecology | Admitting: Obstetrics and Gynecology

## 2021-09-13 ENCOUNTER — Ambulatory Visit: Payer: BC Managed Care – PPO

## 2021-09-13 DIAGNOSIS — Z1231 Encounter for screening mammogram for malignant neoplasm of breast: Secondary | ICD-10-CM

## 2021-09-28 ENCOUNTER — Encounter (HOSPITAL_COMMUNITY): Payer: Self-pay | Admitting: Gastroenterology

## 2021-10-05 ENCOUNTER — Ambulatory Visit (HOSPITAL_COMMUNITY): Payer: BC Managed Care – PPO | Admitting: Anesthesiology

## 2021-10-05 ENCOUNTER — Encounter (HOSPITAL_COMMUNITY): Payer: Self-pay | Admitting: Gastroenterology

## 2021-10-05 ENCOUNTER — Encounter (HOSPITAL_COMMUNITY)
Admission: RE | Disposition: A | Discharge status: Home / Self Care | Payer: Self-pay | Source: Home / Self Care | Attending: Gastroenterology

## 2021-10-05 ENCOUNTER — Ambulatory Visit (HOSPITAL_COMMUNITY)
Admission: RE | Admit: 2021-10-05 | Discharge: 2021-10-05 | Disposition: A | Discharge status: Home / Self Care | Payer: BC Managed Care – PPO | Attending: Gastroenterology | Admitting: Gastroenterology

## 2021-10-05 DIAGNOSIS — Z800 Family history of malignant neoplasm of digestive organs: Secondary | ICD-10-CM | POA: Diagnosis not present

## 2021-10-05 DIAGNOSIS — Z6834 Body mass index (BMI) 34.0-34.9, adult: Secondary | ICD-10-CM | POA: Insufficient documentation

## 2021-10-05 DIAGNOSIS — Z8371 Family history of colonic polyps: Secondary | ICD-10-CM | POA: Insufficient documentation

## 2021-10-05 DIAGNOSIS — I10 Essential (primary) hypertension: Secondary | ICD-10-CM | POA: Insufficient documentation

## 2021-10-05 DIAGNOSIS — G4730 Sleep apnea, unspecified: Secondary | ICD-10-CM | POA: Insufficient documentation

## 2021-10-05 DIAGNOSIS — Z8719 Personal history of other diseases of the digestive system: Secondary | ICD-10-CM | POA: Diagnosis not present

## 2021-10-05 DIAGNOSIS — E039 Hypothyroidism, unspecified: Secondary | ICD-10-CM | POA: Diagnosis not present

## 2021-10-05 DIAGNOSIS — Z1211 Encounter for screening for malignant neoplasm of colon: Secondary | ICD-10-CM | POA: Insufficient documentation

## 2021-10-05 HISTORY — PX: COLONOSCOPY WITH PROPOFOL: SHX5780

## 2021-10-05 HISTORY — PX: POLYPECTOMY: SHX5525

## 2021-10-05 SURGERY — COLONOSCOPY WITH PROPOFOL
Anesthesia: Monitor Anesthesia Care

## 2021-10-05 MED ORDER — PROPOFOL 1000 MG/100ML IV EMUL
INTRAVENOUS | Status: AC
  Filled 2021-10-05: qty 100

## 2021-10-05 MED ORDER — PROPOFOL 10 MG/ML IV BOLUS
INTRAVENOUS | Status: DC | PRN
  Administered 2021-10-05: 30 mg via INTRAVENOUS

## 2021-10-05 MED ORDER — PROPOFOL 500 MG/50ML IV EMUL
INTRAVENOUS | Status: DC | PRN
  Administered 2021-10-05: 125 ug/kg/min via INTRAVENOUS

## 2021-10-05 MED ORDER — LACTATED RINGERS IV SOLN
INTRAVENOUS | Status: DC
  Administered 2021-10-05: 1000 mL via INTRAVENOUS

## 2021-10-05 MED ORDER — LIDOCAINE HCL 1 % IJ SOLN
INTRAMUSCULAR | Status: DC | PRN
  Administered 2021-10-05: 60 mg via INTRADERMAL

## 2021-10-05 SURGICAL SUPPLY — 22 items

## 2021-10-05 NOTE — Anesthesia Postprocedure Evaluation (Signed)
Anesthesia Post Note  Patient: Meredith Johnson  Procedure(s) Performed: COLONOSCOPY WITH PROPOFOL POLYPECTOMY     Patient location during evaluation: Endoscopy Anesthesia Type: MAC Level of consciousness: oriented, awake and alert and awake Pain management: pain level controlled Vital Signs Assessment: post-procedure vital signs reviewed and stable Respiratory status: spontaneous breathing, nonlabored ventilation, respiratory function stable and patient connected to nasal cannula oxygen Cardiovascular status: blood pressure returned to baseline and stable Postop Assessment: no headache, no backache and no apparent nausea or vomiting Anesthetic complications: no   No notable events documented.  Last Vitals:  Vitals:   10/05/21 0918 10/05/21 0920  BP: (!) 166/72 (!) 149/72  Pulse: 60 (!) 58  Resp: 12 14  Temp:    SpO2: 99% 100%    Last Pain:  Vitals:   10/05/21 0918  TempSrc:   PainSc: 0-No pain                 Santa Lighter

## 2021-10-05 NOTE — Discharge Instructions (Signed)
YOU HAD AN ENDOSCOPIC PROCEDURE TODAY: Refer to the procedure report and other information in the discharge instructions given to you for any specific questions about what was found during the examination. If this information does not answer your questions, please call [facility] at [phone] to clarify.   YOU SHOULD EXPECT: Some feelings of bloating in the abdomen. Passage of more gas than usual. Walking can help get rid of the air that was put into your GI tract during the procedure and reduce the bloating.  DIET: Your first meal following the procedure should be a light meal and then it is ok to progress to your normal diet. A half-sandwich or bowl of soup is an example of a good first meal. Heavy or fried foods are harder to digest and may make you feel nauseous or bloated. Drink plenty of fluids but you should avoid alcoholic beverages for 24 hours.   ACTIVITY: Your care partner should take you home directly after the procedure. You should plan to take it easy, moving slowly for the rest of the day. You can resume normal activity the day after the procedure however YOU SHOULD NOT DRIVE, use power tools, machinery or perform tasks that involve climbing or major physical exertion for 24 hours (because of the sedation medicines used during the test).   SYMPTOMS TO REPORT IMMEDIATELY: A gastroenterologist can be reached at any hour. Please call [phone]  for any of the following symptoms:  Following lower endoscopy (colonoscopy, flexible sigmoidoscopy) Excessive amounts of blood in the stool  Significant tenderness, worsening of abdominal pains  Swelling of the abdomen that is new, acute  Fever of 100 or higher    FOLLOW UP:  If any biopsies were taken you will be contacted by phone or by letter within the next 1-3 weeks. Call [phone]  if you have not heard about the biopsies in 3 weeks.  Please also call with any specific questions about appointments or follow up tests.  

## 2021-10-05 NOTE — Transfer of Care (Signed)
Immediate Anesthesia Transfer of Care Note  Patient: Meredith Johnson  Procedure(s) Performed: COLONOSCOPY WITH PROPOFOL POLYPECTOMY  Patient Location: PACU and Endoscopy Unit  Anesthesia Type:MAC  Level of Consciousness: oriented, drowsy and patient cooperative  Airway & Oxygen Therapy: Patient Spontanous Breathing and Patient connected to face mask oxygen  Post-op Assessment: Report given to RN and Post -op Vital signs reviewed and stable  Post vital signs: Reviewed and stable  Last Vitals:  Vitals Value Taken Time  BP 145/71 10/05/21 0900  Temp    Pulse 60 10/05/21 0901  Resp 17 10/05/21 0901  SpO2 97 % 10/05/21 0901  Vitals shown include unvalidated device data.  Last Pain:  Vitals:   10/05/21 0724  TempSrc: Temporal  PainSc: 0-No pain         Complications: No notable events documented.

## 2021-10-05 NOTE — Anesthesia Preprocedure Evaluation (Addendum)
Anesthesia Evaluation  Patient identified by MRN, date of birth, ID band Patient awake    Reviewed: Allergy & Precautions, NPO status , Patient's Chart, lab work & pertinent test results, reviewed documented beta blocker date and time   Airway Mallampati: III  TM Distance: >3 FB Neck ROM: Full    Dental  (+) Teeth Intact, Dental Advisory Given   Pulmonary sleep apnea ,    Pulmonary exam normal breath sounds clear to auscultation       Cardiovascular hypertension, Pt. on medications and Pt. on home beta blockers Normal cardiovascular exam Rhythm:Regular Rate:Normal     Neuro/Psych negative neurological ROS  negative psych ROS   GI/Hepatic Neg liver ROS, hiatal hernia,   Endo/Other  Hypothyroidism Morbid obesity  Renal/GU negative Renal ROS     Musculoskeletal negative musculoskeletal ROS (+)   Abdominal   Peds  Hematology negative hematology ROS (+)   Anesthesia Other Findings   Reproductive/Obstetrics                            Anesthesia Physical Anesthesia Plan  ASA: 3  Anesthesia Plan: MAC   Post-op Pain Management: Minimal or no pain anticipated   Induction: Intravenous  PONV Risk Score and Plan: 2 and TIVA and Treatment may vary due to age or medical condition  Airway Management Planned: Natural Airway and Nasal Cannula  Additional Equipment:   Intra-op Plan:   Post-operative Plan:   Informed Consent: I have reviewed the patients History and Physical, chart, labs and discussed the procedure including the risks, benefits and alternatives for the proposed anesthesia with the patient or authorized representative who has indicated his/her understanding and acceptance.     Dental advisory given  Plan Discussed with: CRNA  Anesthesia Plan Comments:        Anesthesia Quick Evaluation

## 2021-10-05 NOTE — H&P (Signed)
Meredith Johnson HPI: This 61 year old black female presents to the office for colorectal cancer screening. She has problems with occasional constipation. She can go 2 days without having a BM. She usually has 1-2 BM's per day with occasional hard stools. She has had bright red blood in the stool when she has the hard BM's She occasionally has acid reflux. She has periumbilical pain when she consumes gluten products. She has gluten intolerant but does not follow a gluten-free diet. The severity of the abdominal pain has reached 5/10 at its worst. She has a good appetite and her weight has been stable. She denies having any complaints of nausea, vomiting, dysphagia or odynophagia. She denies having a family history of celiac sprue or IBD. Her maternal aunt had colon cancer but the age of diagnosis is unknown. Her mother has had colonic polyps removed. She had a normal colonoscopy done on 03/19/2006 by Dr. Verl Blalock at [place].  She also had an EGD done on the same day that revealed a small hiatal hernia.  I reviewed her last sleep study done by Dr. Danton Sewer [I saw the study on her phone] in 2006 where she was noted to have severe sleep apnea.   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 She has never used smokeless tobacco. She reports that she does not drink alcohol and does not use drugs.  Allergies:  Allergies  Allergen Reactions   Diclofenac Sodium     Pt not aware of reaction    Azor [Amlodipine-Olmesartan] Swelling   Lisinopril     Hives and swelling     Medications: Scheduled: Continuous:  lactated ringers      No results found for this or any previous visit (from the past 24 hour(s)).   No results found.  ROS:  As stated above in the HPI otherwise negative.  There were no vitals taken for this visit.    PE: Gen: NAD, Alert and Oriented HEENT:  [place]/AT, EOMI Neck: Supple, no LAD Lungs: CTA Bilaterally CV: RRR without M/G/R ABD: Soft, NTND, +BS Ext: No C/C/E  Assessment/Plan: 1) Screening colonoscopy.  Zarria Towell D 10/05/2021, 7:18 AM

## 2021-10-05 NOTE — Op Note (Signed)
[facility] Patient Name: Meredith Johnson Procedure Date: 10/05/2021 MRN: [id number] Attending MD: Carol Ada , MD Date of Birth: 02-08-1961 CSN: [id number] Age: 61 Admit Type: Outpatient Procedure:                Colonoscopy Indications:              Screening for colorectal malignant neoplasm Providers:                Carol Ada, MD, Dulcy Fanny, Despina Pole, Technician, Dellie Catholic Referring MD:              Medicines:                Propofol per Anesthesia Complications:            No immediate complications. Estimated Blood Loss:     Estimated blood loss: none. Procedure:                Pre-Anesthesia Assessment:                           - Prior to the procedure, a History and Physical                            was performed, and patient medications and                            allergies were reviewed. The patient's tolerance of                            previous anesthesia was also reviewed. The risks                            and benefits of the procedure and the sedation                            options and risks were discussed with the patient.                            All questions were answered, and informed consent                            was obtained. Prior Anticoagulants: The patient has                            taken no previous anticoagulant or antiplatelet                            agents. ASA Grade Assessment: III - A patient with                            severe systemic disease. After reviewing the risks  and benefits, the patient was deemed in                            satisfactory condition to undergo the procedure.                           - Sedation was administered by an anesthesia                            professional. Deep sedation was attained.                           After obtaining informed consent, the colonoscope                            was passed  under direct vision. Throughout the                            procedure, the patient's blood pressure, pulse, and                            oxygen saturations were monitored continuously. The                            CF-HQ190L ([id number] Olympus colonoscope was                            introduced through the anus and advanced to the the                            cecum, identified by appendiceal orifice and                            ileocecal valve. The colonoscopy was performed                            without difficulty. The patient tolerated the                            procedure well. The quality of the bowel                            preparation was evaluated using the BBPS [facility]                            Bowel Preparation Scale) with scores of: Right                            Colon = 3 (entire mucosa seen well with no residual                            staining, small fragments of stool or opaque  liquid), Transverse Colon = 3 (entire mucosa seen                            well with no residual staining, small fragments of                            stool or opaque liquid) and Left Colon = 3 (entire                            mucosa seen well with no residual staining, small                            fragments of stool or opaque liquid). The total                            BBPS score equals 9. The quality of the bowel                            preparation was good. The ileocecal valve,                            appendiceal orifice, and rectum were photographed. Scope In: 8:28:20 AM Scope Out: 8:49:04 AM Scope Withdrawal Time: 0 hours 15 minutes 47 seconds  Total Procedure Duration: 0 hours 20 minutes 44 seconds  Findings:      Two sessile polyps were found in the descending colon and cecum. The       polyps were 2 to 3 mm in size. These polyps were removed with a cold       snare. Resection and retrieval were complete. Impression:                - Two 2 to 3 mm polyps in the descending colon and                            in the cecum, removed with a cold snare. Resected                            and retrieved. Moderate Sedation:      Not Applicable - Patient had care per Anesthesia. Recommendation:           - Patient has a contact number available for                            emergencies. The signs and symptoms of potential                            delayed complications were discussed with the                            patient. Return to normal activities tomorrow.                            Written discharge instructions were provided to the  patient.                           - Resume previous diet.                           - Continue present medications.                           - Await pathology results.                           - Repeat colonoscopy in 7 years for surveillance. Procedure Code(s):        --- Professional ---                           [phone], Colonoscopy, flexible; with removal of                            tumor(s), polyp(s), or other lesion(s) by snare                            technique Diagnosis Code(s):        --- Professional ---                           Z12.11, Encounter for screening for malignant                            neoplasm of colon                           K63.5, Polyp of colon CPT copyright 2019 American [employer] Association. All rights reserved. The codes documented in this report are preliminary and upon coder review may  be revised to meet current compliance requirements. Carol Ada, MD Carol Ada, MD 10/05/2021 8:55:25 AM This report has been signed electronically. Number of Addenda: 0

## 2021-10-08 ENCOUNTER — Encounter (HOSPITAL_COMMUNITY): Payer: Self-pay | Admitting: Gastroenterology

## 2021-10-08 LAB — SURGICAL PATHOLOGY

## 2022-08-09 IMAGING — MG MM DIGITAL SCREENING BILAT W/ TOMO AND CAD
8 of 14 series · 8 of 40 positions shown · non-contrast
Comparison: Previous exam(s).

CLINICAL DATA: Screening.

EXAM:
DIGITAL SCREENING BILATERAL MAMMOGRAM WITH TOMOSYNTHESIS AND CAD
TECHNIQUE: Bilateral screening digital craniocaudal and mediolateral oblique
mammograms were obtained. Bilateral screening digital breast
tomosynthesis was performed. The images were evaluated with
computer-aided detection.

[L CC synth-2D]
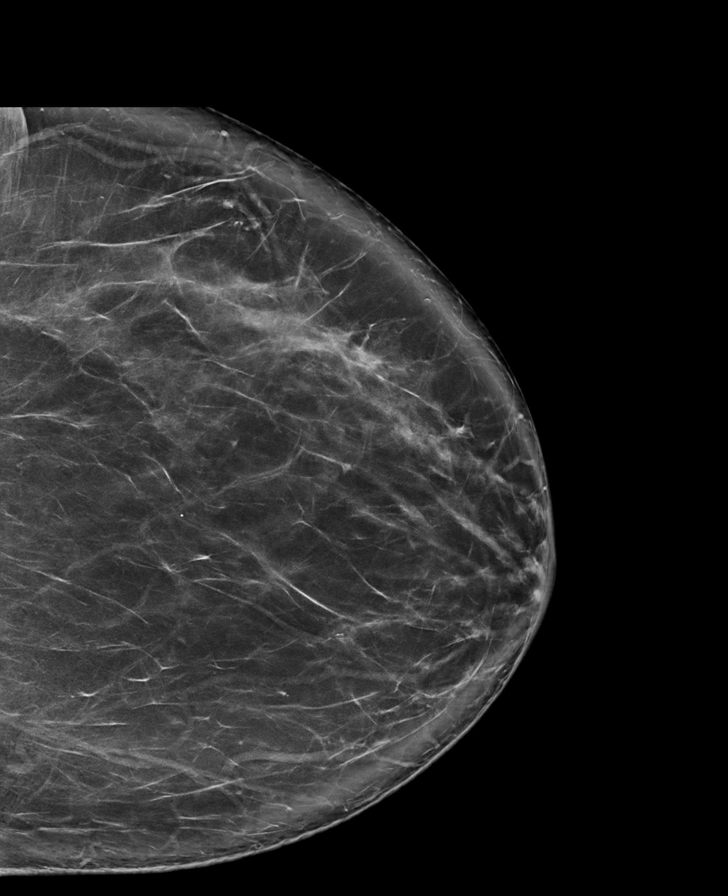

[L MLO synth-2D (1 of 2)]
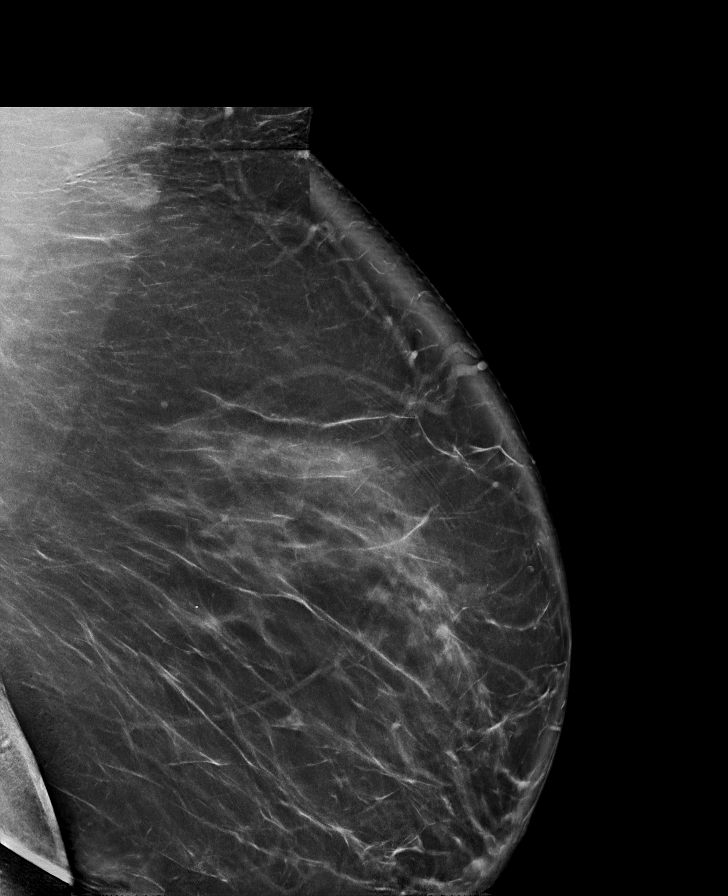

[L MLO synth-2D (2 of 2)]
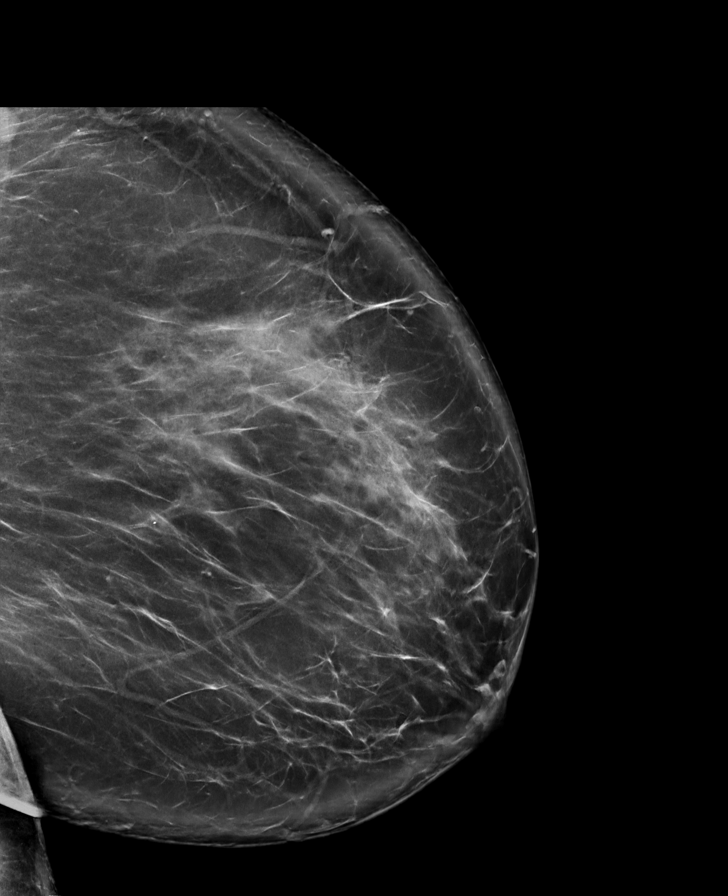

[R CV synth-2D]
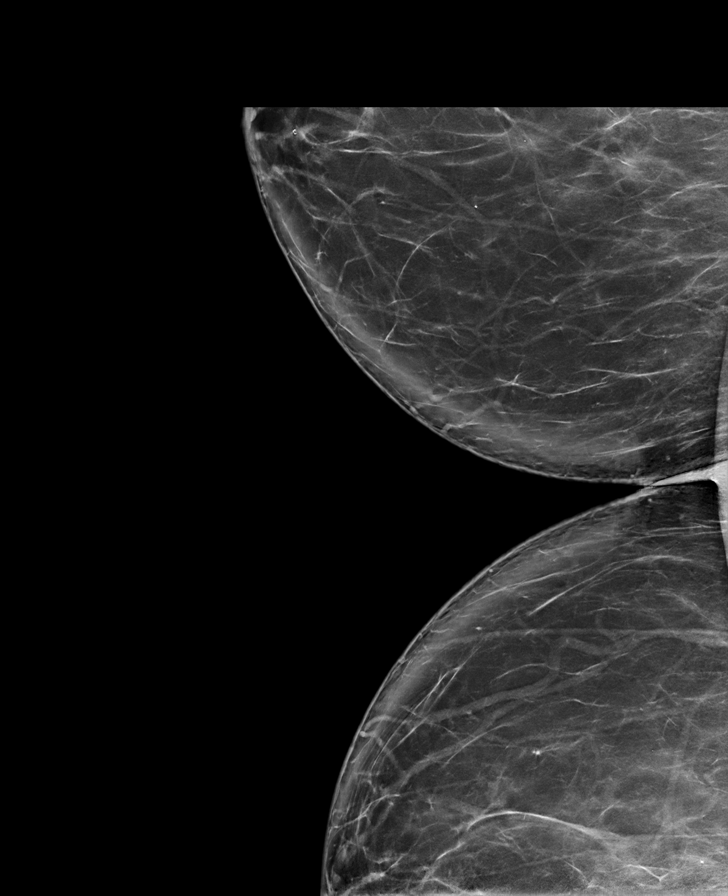

[R CC synth-2D]
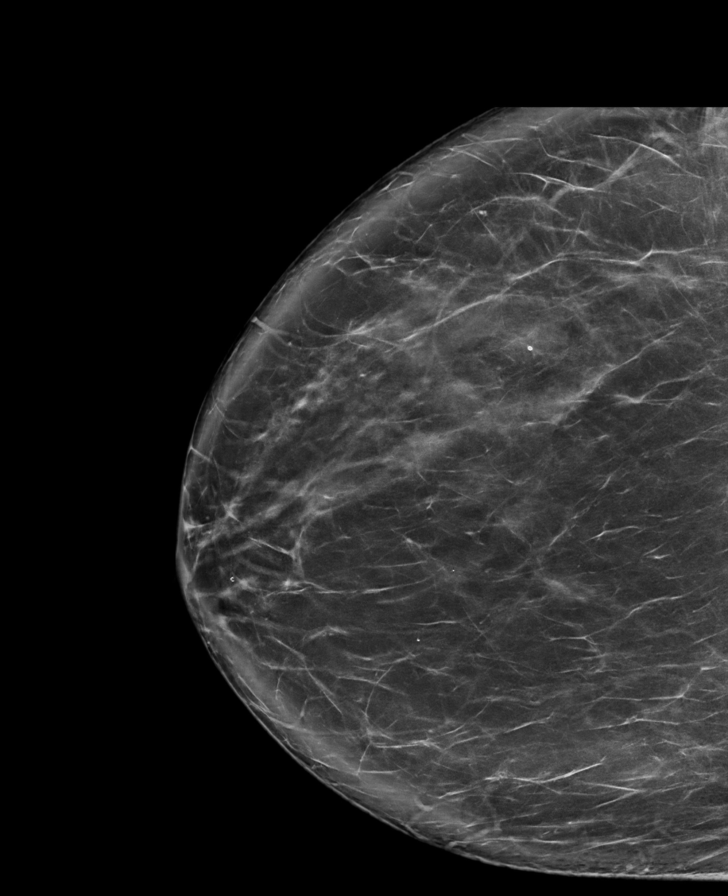

[R MLO synth-2D (1 of 2)]
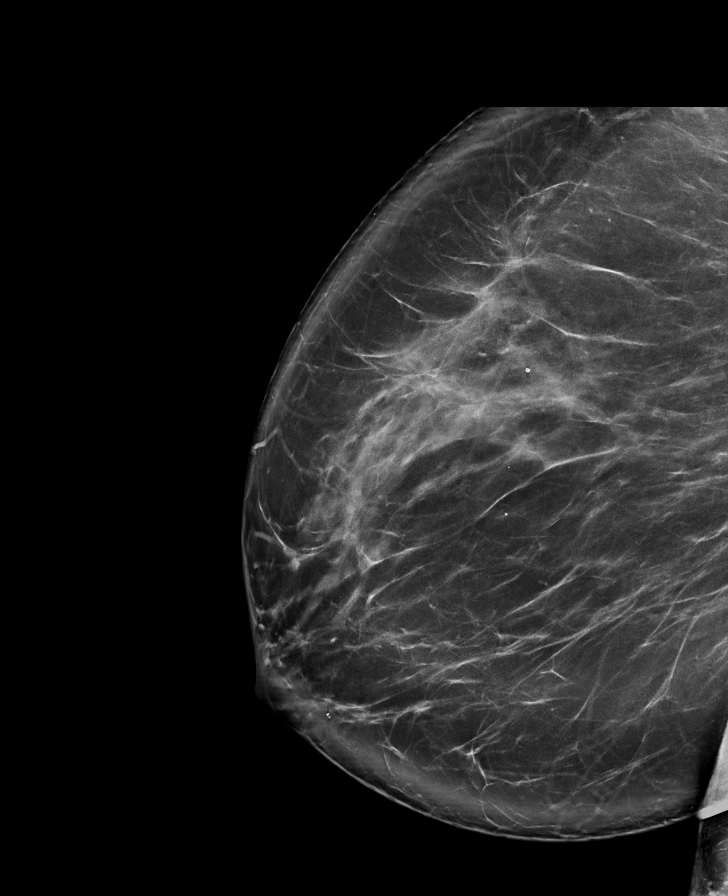

[R MLO synth-2D (2 of 2)]
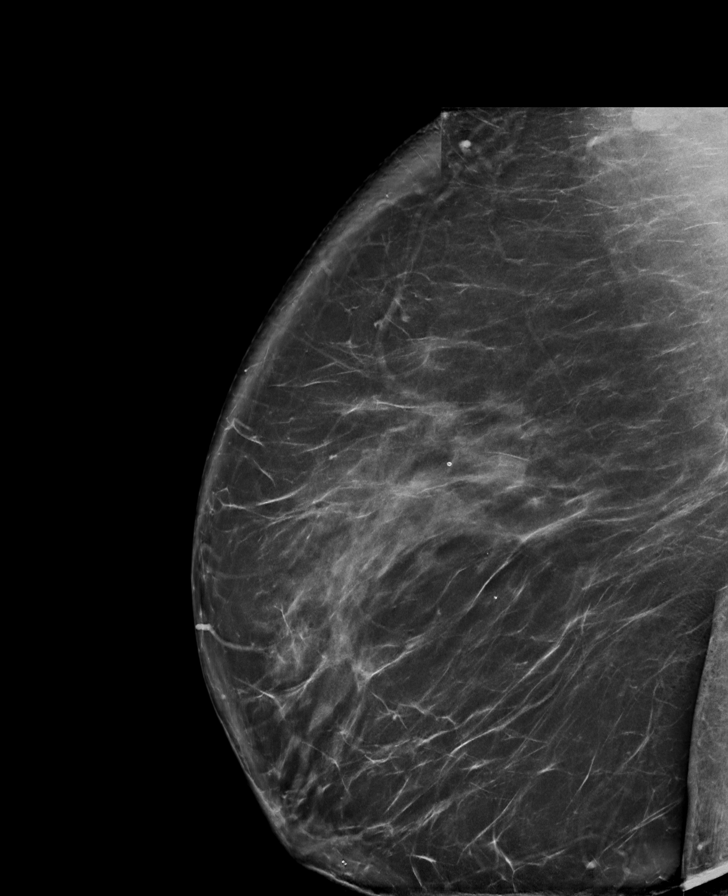

[R MLO tomo · tomo slice 61/122.0]
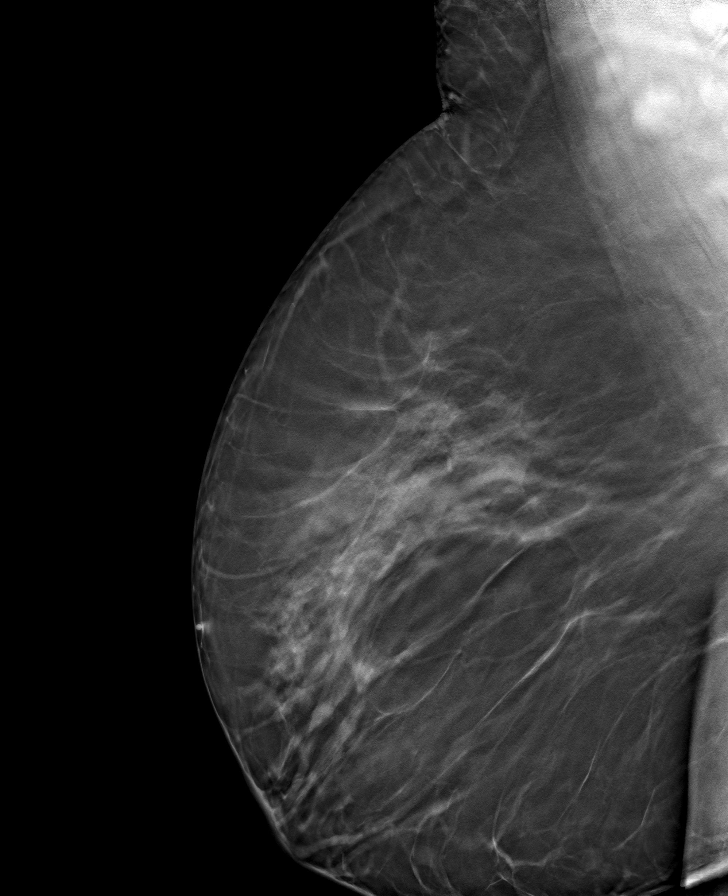

[8 of 40 positions shown; findings below may reference images not displayed]

ACR Breast Density Category b: There are scattered areas of
fibroglandular density.
FINDINGS: There are no findings suspicious for malignancy. The images were
evaluated with computer-aided detection.
IMPRESSION: No mammographic evidence of malignancy. A result letter of this
screening mammogram will be mailed directly to the patient.

RECOMMENDATION:
Screening mammogram in one year. (Code:WJ-I-BG6)

BI-RADS CATEGORY  1: Negative.

## 2023-01-02 ENCOUNTER — Ambulatory Visit: Payer: BC Managed Care – PPO | Admitting: Cardiology

## 2023-04-16 ENCOUNTER — Ambulatory Visit: Payer: BC Managed Care – PPO | Attending: Cardiology | Admitting: Cardiology

## 2023-04-16 ENCOUNTER — Encounter: Payer: Self-pay | Admitting: Cardiology

## 2023-04-16 VITALS — BP 160/74 | HR 64 | Ht 65.0 in | Wt 242.2 lb

## 2023-04-16 DIAGNOSIS — G4733 Obstructive sleep apnea (adult) (pediatric): Secondary | ICD-10-CM

## 2023-04-16 DIAGNOSIS — Z7689 Persons encountering health services in other specified circumstances: Secondary | ICD-10-CM | POA: Diagnosis not present

## 2023-04-16 DIAGNOSIS — R7303 Prediabetes: Secondary | ICD-10-CM

## 2023-04-16 DIAGNOSIS — R0609 Other forms of dyspnea: Secondary | ICD-10-CM | POA: Diagnosis not present

## 2023-04-16 DIAGNOSIS — R9431 Abnormal electrocardiogram [ECG] [EKG]: Secondary | ICD-10-CM | POA: Diagnosis not present

## 2023-04-16 DIAGNOSIS — I10 Essential (primary) hypertension: Secondary | ICD-10-CM | POA: Diagnosis not present

## 2023-04-16 DIAGNOSIS — Z01812 Encounter for preprocedural laboratory examination: Secondary | ICD-10-CM

## 2023-04-16 MED ORDER — METOPROLOL TARTRATE 50 MG PO TABS: ORAL_TABLET | ORAL | 0 refills | Status: AC

## 2023-04-16 MED ORDER — SPIRONOLACTONE 25 MG PO TABS: 12.5000 mg | ORAL_TABLET | Freq: Every day | ORAL | 3 refills | Status: AC

## 2023-04-16 NOTE — Patient Instructions (Addendum)
Medication Instructions:  Your physician has recommended you make the following change in your medication:  START: Aldactone 12.5 mg once daily *If you need a refill on your cardiac medications before your next appointment, please call your pharmacy*   Lab Work: CMET, Mag If you have labs (blood work) drawn today and your tests are completely normal, you will receive your results only by: MyChart Message (if you have MyChart) OR A paper copy in the mail If you have any lab test that is abnormal or we need to change your treatment, we will call you to review the results.   Testing/Procedures: Your physician has requested that you have an echocardiogram. Echocardiography is a painless test that uses sound waves to create images of your heart. It provides your doctor with information about the size and shape of your heart and how well your heart's chambers and valves are working. This procedure takes approximately one hour. There are no restrictions for this procedure. Please do NOT wear cologne, perfume, aftershave, or lotions (deodorant is allowed). Please arrive 15 minutes prior to your appointment time.  Please note: We ask at that you not bring children with you during ultrasound (echo/ vascular) testing. Due to room size and safety concerns, children are not allowed in the ultrasound rooms during exams. Our front office staff cannot provide observation of children in our lobby area while testing is being conducted. An adult accompanying a patient to their appointment will only be allowed in the ultrasound room at the discretion of the ultrasound technician under special circumstances. We apologize for any inconvenience.    Your cardiac CT will be scheduled at one of the below locations:   [facility] [address] [place], [place] [address] [phone]  If scheduled at [facility] please arrive at the [facility] and Children's Entrance (Entrance C2) of [facility] 30 minutes prior to test start time. You can use the FREE valet parking offered at entrance C (encouraged to control the heart rate for the test)  Proceed to the [facility] Radiology Department (first floor) to check-in and test prep.  All radiology patients and guests should use entrance C2 at [facility], accessed from [facility], even though the hospital's physical address listed is [address].    Please follow these instructions carefully (unless otherwise directed):  An IV will be required for this test and Nitroglycerin will be given.    On the Night Before the Test: Be sure to Drink plenty of water. Do not consume any caffeinated/decaffeinated beverages or chocolate 12 hours prior to your test. Do not take any antihistamines 12 hours prior to your test. If the patient has contrast allergy: Patient will need a prescription for Prednisone and very clear instructions (as follows): Prednisone 50 mg - take 13 hours prior to test Take another Prednisone 50 mg 7 hours prior to test Take another Prednisone 50 mg 1 hour prior to test Take Benadryl 50 mg 1 hour prior to test Patient must complete all four doses of above prophylactic medications. Patient will need a ride after test due to Benadryl.  On the Day of the Test: Drink plenty of water until 1 hour prior to the test. Do not eat any food 1 hour prior to test. You may take your regular medications prior to the test.  Take metoprolol (Lopressor) two hours prior to test. HOLD Labetalol day of the procedure.  If you take Furosemide/Hydrochlorothiazide/Spironolactone/Chlorthalidone, please HOLD  on the morning of the test. Patients who wear a continuous glucose monitor MUST remove the device prior to scanning. FEMALES- please wear underwire-free bra if available, avoid dresses & tight clothing      After the Test: Drink plenty of water. After receiving IV contrast, you may experience a  mild flushed feeling. This is normal. On occasion, you may experience a mild rash up to 24 hours after the test. This is not dangerous. If this occurs, you can take Benadryl 25 mg and increase your fluid intake. If you experience trouble breathing, this can be serious. If it is severe call 911 IMMEDIATELY. If it is mild, please call our office.  We will call to schedule your test 2-4 weeks out understanding that some insurance companies will need an authorization prior to the service being performed.   For more information and frequently asked questions, please visit our website : [email or web address]  For non-scheduling related questions, please contact the cardiac imaging nurse navigator should you have any questions/concerns: Cardiac Imaging Nurse Navigators Direct Office Dial: [phone]   For scheduling needs, including cancellations and rescheduling, please call [place], [phone].    Follow-Up: At [facility], you and your health needs are our priority.  As part of our continuing mission to provide you with exceptional heart care, we have created designated Provider Care Teams.  These Care Teams include your primary Cardiologist (physician) and Advanced Practice Providers (APPs -  Physician Assistants and Nurse Practitioners) who all work together to provide you with the care you need, when you need it.  We recommend signing up for the patient portal called "MyChart".  Sign up information is provided on this After Visit Summary.  MyChart is used to connect with patients for Virtual Visits (Telemedicine).  Patients are able to view lab/test results, encounter notes, upcoming appointments, etc.  Non-urgent messages can be sent to your provider as well.   To learn more about what you can do with MyChart, go to [email or web address].    Your next appointment:   16 week(s)  Provider:   Thomasene Ripple, DO      Other Instructions:

## 2023-04-16 NOTE — Progress Notes (Signed)
Cardiology Office Note:    Date:  04/16/2023   ID:  Meredith Johnson, DOB 05/26/1960, MRN [id number]  PCP:  Georgann Housekeeper, MD  Cardiologist:  Thomasene Ripple, DO  Electrophysiologist:  None   Referring MD: Georgann Housekeeper, MD   " I am here for my blood pressure"  History of Present Illness:    Meredith Johnson is a 63 y.o. female with a hx of  hypertension, Prediabetes, obesity, gestational hypertension, OSA on CPAP, hypothyroidism was  referred by Dr. Cherly Hensen for further management of her hypertension.   She has been struggling with blood pressure control for over two decades, starting in her thirties. Over the years, she has tried numerous medications, including hydrochlorothiazide, lisinopril ( lip swelling), Azor ( lip swelling), clonidine, and hydralazine, but had to discontinue them due to adverse reactions such as headaches, lip swelling, and skin reactions. Currently, she is on labetalol and amlodipine, which she tolerates well, but her blood pressure remains uncontrolled.  She admits to dyspnea on exertion. This has been for some time now. No chest pain.   The patient reports a lack of energy and motivation to engage in physical activity, which she believes contributes to her high blood pressure. She expresses a desire to lose weight and improve her diet, as she has noticed an improvement in her blood pressure when she does so.  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   PLANTAR FASCIITIS, RIGHT 07/13/2009   SLEEP APNEA 06/20/2007    Past Surgical History:  Procedure Laterality Date   ANKLE SURGERY     1995   COLONOSCOPY WITH  PROPOFOL N/A 10/05/2021   Procedure: COLONOSCOPY WITH PROPOFOL;  Surgeon: Jeani Hawking, MD;  Location: [facility];  Service: Gastroenterology;  Laterality: N/A;   PANENDOSCOPY     POLYPECTOMY  10/05/2021   Procedure: POLYPECTOMY;  Surgeon: Jeani Hawking, MD;  Location: [facility];  Service: Gastroenterology;;    Current Medications: Current Meds  Medication Sig   amLODipine (NORVASC) 10 MG tablet Take 10 mg by mouth daily.   labetalol (NORMODYNE) 200 MG tablet Take 400 mg by mouth 2 (two) times daily.   levothyroxine (SYNTHROID) 175 MCG tablet TAKE 1 TABLET (175 MCG TOTAL) BY MOUTH DAILY BEFORE BREAKFAST.   magnesium oxide (MAG-OX) 400 (240 Mg) MG tablet Take 400 mg by mouth at bedtime.   metoprolol tartrate (LOPRESSOR) 50 MG tablet Take 2 hours before CT scan   spironolactone (ALDACTONE) 25 MG tablet Take 0.5 tablets (12.5 mg total) by mouth daily.     Allergies:   Diclofenac sodium, Azor [amlodipine-olmesartan], and Lisinopril   Social History   Socioeconomic History   Marital status: Single    Spouse name: Not on file   Number of children: Not on file   Years of education: Not on file   Highest education level: Not on file  Occupational History   Occupation: processor    Employer: [employer]    Comment: [employer]  Tobacco Use   Smoking status: Never   Smokeless tobacco: Never  Substance and Sexual Activity   Alcohol use: No   Drug use: No  Sexual activity: Yes  Other Topics Concern   Not on file  Social History Narrative   Not on file   Social Drivers of Health   Financial Resource Strain: Not on file  Food Insecurity: Low Risk  (03/23/2023)   Received from [facility]   Hunger Vital Sign    Worried About Running Out of Food in the Last Year: Never true    Ran Out of Food in the Last Year: Never true  Transportation Needs: No Transportation Needs (03/23/2023)   Received from [facility]    In the past 12 months, has  lack of reliable transportation kept you from medical appointments, meetings, work or from getting things needed for daily living? : No  Physical Activity: Not on file  Stress: Not on file  Social Connections: Unknown (07/23/2021)   Received from [facility]   Social Network    Social Network: Not on file     Family History: The patient's family history includes Breast cancer in her sister; Cancer in her father; Goiter in her mother; Hyperlipidemia in her mother; Hypertension in her mother.  ROS:   Review of Systems  Constitution: Negative for decreased appetite, fever and weight gain.  HENT: Negative for congestion, ear discharge, hoarse voice and sore throat.   Eyes: Negative for discharge, redness, vision loss in right eye and visual halos.  Cardiovascular: Negative for chest pain, dyspnea on exertion, leg swelling, orthopnea and palpitations.  Respiratory: Negative for cough, hemoptysis, shortness of breath and snoring.   Endocrine: Negative for heat intolerance and polyphagia.  Hematologic/Lymphatic: Negative for bleeding problem. Does not bruise/bleed easily.  Skin: Negative for flushing, nail changes, rash and suspicious lesions.  Musculoskeletal: Negative for arthritis, joint pain, muscle cramps, myalgias, neck pain and stiffness.  Gastrointestinal: Negative for abdominal pain, bowel incontinence, diarrhea and excessive appetite.  Genitourinary: Negative for decreased libido, genital sores and incomplete emptying.  Neurological: Negative for brief paralysis, focal weakness, headaches and loss of balance.  Psychiatric/Behavioral: Negative for altered mental status, depression and suicidal ideas.  Allergic/Immunologic: Negative for HIV exposure and persistent infections.    EKGs/Labs/Other Studies Reviewed:    The following studies were reviewed today:   EKG:  The ekg ordered today demonstrates   Recent Labs: No results found for requested labs within last  365 days.  Recent Lipid Panel    Component Value Date/Time   CHOL 97 12/06/2009 0816   TRIG 40.0 12/06/2009 0816   HDL 35.80 (L) 12/06/2009 0816   CHOLHDL 3 12/06/2009 0816   VLDL 8.0 12/06/2009 0816   LDLCALC 53 12/06/2009 0816    Physical Exam:    VS:  BP (!) 160/74   Pulse 64   Ht 5\' 5"  (1.651 m)   Wt 242 lb 3.2 oz (109.9 kg)   SpO2 94%   BMI 40.30 kg/m     Wt Readings from Last 3 Encounters:  04/16/23 242 lb 3.2 oz (109.9 kg)  10/05/21 210 lb (95.3 kg)  08/18/19 235 lb (106.6 kg)     GEN: Well nourished, well developed in no acute distress HEENT: Normal NECK: No JVD; No carotid bruits LYMPHATICS: No lymphadenopathy CARDIAC: S1S2 noted,RRR, no murmurs, rubs, gallops RESPIRATORY:  Clear to auscultation without rales, wheezing or rhonchi  ABDOMEN: Soft, non-tender, non-distended, +bowel sounds, no guarding. EXTREMITIES: No edema, No cyanosis, no clubbing MUSCULOSKELETAL:  No deformity  SKIN: Warm and dry NEUROLOGIC:  Alert and oriented x 3, non-focal PSYCHIATRIC:  Normal  affect, good insight  ASSESSMENT:    1. HYPERTENSION   2. Encounter to establish care   3. Abnormal electrocardiogram   4. DOE (dyspnea on exertion)   5. Pre-procedure lab exam   6. Prediabetes   7. Obstructive sleep apnea syndrome    PLAN:     Hypertension Long-standing, poorly controlled hypertension despite current regimen of Labetalol and Amlodipine. Multiple previous antihypertensive medications discontinued due to adverse reactions. Noted leg swelling. -Start Spironolactone 12.5mg  daily to help with blood pressure control. Continue both Amlodipine and Labetalol. -Check blood pressure in 2 weeks with pharmacist. -Order echocardiogram to assess for any structural abnormalities.   Dyspnea on exertion - The symptoms chest pain is concerning, this patient does have intermediate risk for coronary artery disease and at this time I would like to pursue an ischemic evaluation in this patient.   Shared decision a coronary CTA at this time is appropriate.  I have discussed with the patient about the testing.  The patient has no IV contrast allergy and is agreeable to proceed with this test.    Prediabetes Noted from previous lab work in June of last year. -Refer to health coach for diet and physical activity counseling.  Sleep Apnea Currently treated with CPAP. Recent sleep study performed. -Review results of recent sleep study to assess for potential need for adjustment of CPAP settings.  Morbid obesity - lifestyle modification advised  General Health Maintenance -Invite to Women's Heart event on February 7th for education on heart disease prevention in women. -Follow-up in 16 weeks to discuss results of testing.  The patient is in agreement with the above plan. The patient left the office in stable condition.  The patient will follow up in   Medication Adjustments/Labs and Tests Ordered: Current medicines are reviewed at length with the patient today.  Concerns regarding medicines are outlined above.  Orders Placed This Encounter  Procedures   CT CORONARY MORPH W/CTA COR W/SCORE W/CA W/CM &/OR WO/CM   Comprehensive Metabolic Panel (CMET)   Magnesium   AMB Referral to Heartcare Pharm-D   EKG 12-Lead   ECHOCARDIOGRAM COMPLETE   Meds ordered this encounter  Medications   spironolactone (ALDACTONE) 25 MG tablet    Sig: Take 0.5 tablets (12.5 mg total) by mouth daily.    Dispense:  45 tablet    Refill:  3   metoprolol tartrate (LOPRESSOR) 50 MG tablet    Sig: Take 2 hours before CT scan    Dispense:  1 tablet    Refill:  0    Patient Instructions  Medication Instructions:  Your physician has recommended you make the following change in your medication:  START: Aldactone 12.5 mg once daily *If you need a refill on your cardiac medications before your next appointment, please call your pharmacy*   Lab Work: CMET, Mag If you have labs (blood work) drawn today and  your tests are completely normal, you will receive your results only by: MyChart Message (if you have MyChart) OR A paper copy in the mail If you have any lab test that is abnormal or we need to change your treatment, we will call you to review the results.   Testing/Procedures: Your physician has requested that you have an echocardiogram. Echocardiography is a painless test that uses sound waves to create images of your heart. It provides your doctor with information about the size and shape of your heart and how well your heart's chambers and valves are working. This procedure takes approximately  one hour. There are no restrictions for this procedure. Please do NOT wear cologne, perfume, aftershave, or lotions (deodorant is allowed). Please arrive 15 minutes prior to your appointment time.  Please note: We ask at that you not bring children with you during ultrasound (echo/ vascular) testing. Due to room size and safety concerns, children are not allowed in the ultrasound rooms during exams. Our front office staff cannot provide observation of children in our lobby area while testing is being conducted. An adult accompanying a patient to their appointment will only be allowed in the ultrasound room at the discretion of the ultrasound technician under special circumstances. We apologize for any inconvenience.    Your cardiac CT will be scheduled at one of the below locations:   [facility] [address] [place], [place] [address] [phone]  If scheduled at [facility], please arrive at the [facility] and Children's Entrance (Entrance C2) of [facility] 30 minutes prior to test start time. You can use the FREE valet parking offered at entrance C (encouraged to control the heart rate for the test)  Proceed to the [facility] Radiology Department (first floor) to check-in and test prep.  All radiology patients and guests should use entrance C2 at [facility], accessed from [facility], even though the hospital's physical address listed is [address].    Please follow these instructions carefully (unless otherwise directed):  An IV will be required for this test and Nitroglycerin will be given.    On the Night Before the Test: Be sure to Drink plenty of water. Do not consume any caffeinated/decaffeinated beverages or chocolate 12 hours prior to your test. Do not take any antihistamines 12 hours prior to your test. If the patient has contrast allergy: Patient will need a prescription for Prednisone and very clear instructions (as follows): Prednisone 50 mg - take 13 hours prior to test Take another Prednisone 50 mg 7 hours prior to test Take another Prednisone 50 mg 1 hour prior to test Take Benadryl 50 mg 1 hour prior to test Patient must complete all four doses of above prophylactic medications. Patient will need a ride after test due to Benadryl.  On the Day of the Test: Drink plenty of water until 1 hour prior to the test. Do not eat any food 1 hour prior to test. You may take your regular medications prior to the test.  Take metoprolol (Lopressor) two hours prior to test. HOLD Labetalol day of the procedure.  If you take Furosemide/Hydrochlorothiazide/Spironolactone/Chlorthalidone, please HOLD on the morning of the test. Patients who wear a continuous glucose monitor MUST remove the device prior to scanning. FEMALES- please wear underwire-free bra if available, avoid dresses & tight clothing      After the Test: Drink plenty of water. After receiving IV contrast, you may experience a mild flushed feeling. This is normal. On occasion, you may experience a mild rash up to 24 hours after the test. This is not dangerous. If this occurs, you can take Benadryl 25 mg and increase your fluid intake. If you experience trouble breathing, this can be serious. If it is severe call 911 IMMEDIATELY. If it is mild,  please call our office.  We will call to schedule your test 2-4 weeks out understanding that some insurance companies will need an authorization prior to the service being performed.   For more information and frequently asked questions, please visit our website : [email or web address]  For non-scheduling related questions, please contact the cardiac imaging nurse navigator should you have any questions/concerns: Cardiac Imaging Nurse Navigators Direct Office Dial: [phone]   For scheduling needs, including cancellations and rescheduling, please call [place], [phone].    Follow-Up: At [facility], you and your health needs are our priority.  As part of our continuing mission to provide you with exceptional heart care, we have created designated Provider Care Teams.  These Care Teams include your primary Cardiologist (physician) and Advanced Practice Providers (APPs -  Physician Assistants and Nurse Practitioners) who all work together to provide you with the care you need, when you need it.  We recommend signing up for the patient portal called "MyChart".  Sign up information is provided on this After Visit Summary.  MyChart is used to connect with patients for Virtual Visits (Telemedicine).  Patients are able to view lab/test results, encounter notes, upcoming appointments, etc.  Non-urgent messages can be sent to your provider as well.   To learn more about what you can do with MyChart, go to [email or web address].    Your next appointment:   16 week(s)  Provider:   Thomasene Ripple, DO      Other Instructions:      Adopting a Healthy Lifestyle.  Know what a healthy weight is for you (roughly BMI <25) and aim to maintain this   Aim for 7+ servings of fruits and vegetables daily   65-80+ fluid ounces of water or unsweet tea for healthy kidneys   Limit to max 1 drink of alcohol per day; avoid smoking/tobacco   Limit animal fats in diet for  cholesterol and heart health - choose grass fed whenever available   Avoid highly processed foods, and foods high in saturated/trans fats   Aim for low stress - take time to unwind and care for your mental health   Aim for 150 min of moderate intensity exercise weekly for heart health, and weights twice weekly for bone health   Aim for 7-9 hours of sleep daily   When it comes to diets, agreement about the perfect plan isnt easy to find, even among the experts. Experts at the [facility] of [employer] developed an idea known as the Healthy Eating Plate. Just imagine a plate divided into logical, healthy portions.   The emphasis is on diet quality:   Load up on vegetables and fruits - one-half of your plate: Aim for color and variety, and remember that potatoes dont count.   Go for whole grains - one-quarter of your plate: Whole wheat, barley, wheat berries, quinoa, oats, brown rice, and foods made with them. If you want pasta, go with whole wheat pasta.   Protein power - one-quarter of your plate: Fish, chicken, beans, and nuts are all healthy, versatile protein sources. Limit red meat.   The diet, however, does go beyond the plate, offering a few other suggestions.   Use healthy plant oils, such as olive, canola, soy, corn, sunflower and peanut. Check the labels, and avoid partially hydrogenated oil, which have unhealthy trans fats.   If youre thirsty, drink water. Coffee and tea are good in moderation, but skip sugary drinks and limit milk and dairy products to one or two daily servings.   The type of carbohydrate in the diet is more important than the amount. Some sources of carbohydrates, such as vegetables, fruits, whole grains, and beans-are healthier than others.   Finally, stay active  Osvaldo Shipper, DO  04/16/2023 8:53  PM    [facility]

## 2023-04-17 LAB — COMPREHENSIVE METABOLIC PANEL
ALT: 12 [IU]/L (ref 0–32)
AST: 18 [IU]/L (ref 0–40)
Albumin: 3.9 g/dL (ref 3.9–4.9)
Alkaline Phosphatase: 89 [IU]/L (ref 44–121)
BUN/Creatinine Ratio: 16 (ref 12–28)
BUN: 17 mg/dL (ref 8–27)
Bilirubin Total: 0.3 mg/dL (ref 0.0–1.2)
CO2: 22 mmol/L (ref 20–29)
Calcium: 8.7 mg/dL (ref 8.7–10.3)
Chloride: 105 mmol/L (ref 96–106)
Creatinine, Ser: 1.08 mg/dL — ABNORMAL HIGH (ref 0.57–1.00)
Globulin, Total: 3.3 g/dL (ref 1.5–4.5)
Glucose: 86 mg/dL (ref 70–99)
Potassium: 4 mmol/L (ref 3.5–5.2)
Sodium: 145 mmol/L — ABNORMAL HIGH (ref 134–144)
Total Protein: 7.2 g/dL (ref 6.0–8.5)
eGFR: 58 mL/min/{1.73_m2} — ABNORMAL LOW (ref >59)

## 2023-04-17 LAB — MAGNESIUM: Magnesium: 2.1 mg/dL (ref 1.6–2.3)

## 2023-05-08 ENCOUNTER — Ambulatory Visit (HOSPITAL_COMMUNITY): Payer: BC Managed Care – PPO

## 2023-05-23 ENCOUNTER — Ambulatory Visit (HOSPITAL_COMMUNITY): Payer: BC Managed Care – PPO

## 2023-06-05 ENCOUNTER — Ambulatory Visit: Payer: BC Managed Care – PPO | Attending: Cardiovascular Disease

## 2023-06-05 NOTE — Progress Notes (Deleted)
   Office Visit    Patient Name: Meredith Johnson Date of Encounter: 06/05/2023  Primary Care Provider:  Georgann Housekeeper, MD Primary Cardiologist:  Thomasene Ripple, DO  Chief Complaint    Hypertension  Significant Past Medical History   preDM 6/24 A1c 6.2  OSA On CPAP  Gestational HTN   hypothyroidism On levothyroxine 175 mcg       Allergies  Allergen Reactions   Diclofenac Sodium     Pt not aware of reaction    Azor [Amlodipine-Olmesartan] Swelling   Lisinopril     Hives and swelling     History of Present Illness    Meredith Johnson is a 63 y.o. female patient of Dr ***, in the office today for hypertension evaluation.   Blood Pressure Goal:  130/80  Current Medications:  amlodipine 10 mg every day,  labetalol 400 mg bid, spironolactone 12.5 mg qd  Previously tried:    Family Hx:     Social Hx:      Tobacco:  Alcohol:  Caffeine: Diet:      Exercise:   Home BP readings:      Adherence Assessment  Do you ever forget to take your medication? [] Yes [] No  Do you ever skip doses due to side effects? [] Yes [] No  Do you have trouble affording your medicines? [] Yes [] No  Are you ever unable to pick up your medication due to transportation difficulties? [] Yes [] No  Do you ever stop taking your medications because you don't believe they are helping? [] Yes [] No  Do you check your weight daily? [] Yes [] No   Adherence strategy: ***  Barriers to obtaining medications: ***     Accessory Clinical Findings    Lab Results  Component Value Date   CREATININE 1.08 (H) 04/16/2023   BUN 17 04/16/2023   NA 145 (H) 04/16/2023   K 4.0 04/16/2023   CL 105 04/16/2023   CO2 22 04/16/2023   Lab Results  Component Value Date   ALT 12 04/16/2023   AST 18 04/16/2023   ALKPHOS 89 04/16/2023   BILITOT 0.3 04/16/2023   No results found for: "HGBA1C"  Home Medications    Current Outpatient Medications  Medication Sig Dispense Refill   amLODipine  (NORVASC) 10 MG tablet Take 10 mg by mouth daily.  3   labetalol (NORMODYNE) 200 MG tablet Take 400 mg by mouth 2 (two) times daily.     levothyroxine (SYNTHROID) 175 MCG tablet TAKE 1 TABLET (175 MCG TOTAL) BY MOUTH DAILY BEFORE BREAKFAST. 90 tablet 2   magnesium oxide (MAG-OX) 400 (240 Mg) MG tablet Take 400 mg by mouth at bedtime.     metoprolol tartrate (LOPRESSOR) 50 MG tablet Take 2 hours before CT scan 1 tablet 0   spironolactone (ALDACTONE) 25 MG tablet Take 0.5 tablets (12.5 mg total) by mouth daily. 45 tablet 3   No current facility-administered medications for this visit.     No BP recorded.  {Refresh Note OR Click here to enter BP  :1}***   Assessment & Plan    No problem-specific Assessment & Plan notes found for this encounter.   Phillips Hay PharmD CPP [facility]  [address] [place], [place] [address] [phone]

## 2023-06-19 ENCOUNTER — Ambulatory Visit (HOSPITAL_COMMUNITY): Attending: Cardiology

## 2023-06-19 DIAGNOSIS — I5030 Unspecified diastolic (congestive) heart failure: Secondary | ICD-10-CM | POA: Diagnosis not present

## 2023-06-19 DIAGNOSIS — I083 Combined rheumatic disorders of mitral, aortic and tricuspid valves: Secondary | ICD-10-CM | POA: Diagnosis not present

## 2023-06-19 DIAGNOSIS — R0609 Other forms of dyspnea: Secondary | ICD-10-CM | POA: Insufficient documentation

## 2023-06-19 DIAGNOSIS — I517 Cardiomegaly: Secondary | ICD-10-CM

## 2023-06-19 LAB — ECHOCARDIOGRAM COMPLETE
Area-P 1/2: 2.96 cm2
S' Lateral: 2.9 cm
